# Patient Record
Sex: Female | Born: 2000 | Race: White | Hispanic: No | Marital: Single | Smoking: Current every day smoker
Health system: Southern US, Community
[De-identification: ages and names within clinical notes are randomized; demographics above are authoritative.]

## PROBLEM LIST (undated history)

## (undated) DIAGNOSIS — H7290 Unspecified perforation of tympanic membrane, unspecified ear: Secondary | ICD-10-CM

## (undated) DIAGNOSIS — F909 Attention-deficit hyperactivity disorder, unspecified type: Secondary | ICD-10-CM

## (undated) HISTORY — PX: TYMPANOSTOMY TUBE PLACEMENT: SHX32

## (undated) HISTORY — DX: Attention-deficit hyperactivity disorder, unspecified type: F90.9

## (undated) HISTORY — DX: Unspecified perforation of tympanic membrane, unspecified ear: H72.90

---
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
## 2019-01-15 ENCOUNTER — Encounter: Payer: Self-pay | Admitting: Pediatrics

## 2019-01-15 ENCOUNTER — Ambulatory Visit (INDEPENDENT_AMBULATORY_CARE_PROVIDER_SITE_OTHER): Payer: Medicaid Other | Admitting: Pediatrics

## 2019-01-15 ENCOUNTER — Other Ambulatory Visit: Payer: Self-pay

## 2019-01-15 VITALS — BP 112/76 | Ht 64.0 in | Wt 123.2 lb

## 2019-01-15 DIAGNOSIS — M542 Cervicalgia: Secondary | ICD-10-CM

## 2019-01-15 DIAGNOSIS — Z79899 Other long term (current) drug therapy: Secondary | ICD-10-CM

## 2019-01-15 MED ORDER — AMPHETAMINE-DEXTROAMPHET ER 30 MG PO CP24: 60.0000 mg | ORAL_CAPSULE | Freq: Every day | ORAL | 0 refills | Status: DC

## 2019-01-15 NOTE — Progress Notes (Signed)
Subjective:     [name] [name] is a 18 y.o. female who presents for evaluation of neck pain and medication management for ADHD. Event that precipitated these symptoms: twisting injury while dancing at home. Onset of symptoms was 3 days ago, and have been gradually improving since that time. Current symptoms are stiffness with FROM. Patient denies numbness, parasthesias, tingling in extremities. Patient has had no prior neck problems. Previous treatments: none.  The following portions of the patient's history were reviewed and updated as appropriate: allergies, current medications, past family history, past medical history, past social history, past surgical history and problem list.  Review of Systems Pertinent items are noted in HPI.    Objective:    BP 112/76   Ht 5\' 4"  (1.626 m)   Wt 123 lb 4 oz (55.9 kg)   BMI 21.16 kg/m  General:   alert, cooperative, appears stated age and no distress  External Deformity:  absent  ROM Cervical Spine:  normal range of motion  Midline Tenderness:  absent midline  Paraspinous tenderness:  absent midline  UE Neurologic Exam:  unremarkable    Assessment:    Neck pain due to muscle soresness   Medication management   Plan:    Discussed appropriate exercises. Discussed appropriate use of ice and heat. OTC analgesics as needed.   ADHD meds refilled after normal weight and Blood pressure. Doing well on present dose. See again in 3 months

## 2019-02-09 ENCOUNTER — Ambulatory Visit (INDEPENDENT_AMBULATORY_CARE_PROVIDER_SITE_OTHER): Payer: Medicaid Other | Admitting: Pediatrics

## 2019-02-09 ENCOUNTER — Encounter: Payer: Self-pay | Admitting: Pediatrics

## 2019-02-09 DIAGNOSIS — Z113 Encounter for screening for infections with a predominantly sexual mode of transmission: Secondary | ICD-10-CM

## 2019-02-09 DIAGNOSIS — R112 Nausea with vomiting, unspecified: Secondary | ICD-10-CM

## 2019-02-09 DIAGNOSIS — N921 Excessive and frequent menstruation with irregular cycle: Secondary | ICD-10-CM | POA: Diagnosis not present

## 2019-02-09 DIAGNOSIS — N946 Dysmenorrhea, unspecified: Secondary | ICD-10-CM

## 2019-02-09 DIAGNOSIS — Z720 Tobacco use: Secondary | ICD-10-CM

## 2019-02-09 MED ORDER — NORETHINDRONE 0.35 MG PO TABS: 1.0000 | ORAL_TABLET | Freq: Every day | ORAL | 11 refills | Status: AC

## 2019-02-09 NOTE — Progress Notes (Signed)
THIS RECORD MAY CONTAIN CONFIDENTIAL INFORMATION THAT SHOULD NOT BE RELEASED WITHOUT REVIEW OF THE SERVICE PROVIDER.  Virtual Visit via Video Note  I connected with [name] [name]   on 02/09/19 at 11:00 AM EST by a video enabled telemedicine application and verified that I am speaking with the correct person using two identifiers.    This patient visit was completed through the use of an audio/video or telephone encounter in the setting of the State of Emergency due to the COVID-19 Pandemic.  I discussed that the purpose of this telehealth visit is to provide medical care while limiting exposure to the novel coronavirus.       I discussed the limitations of evaluation and management by telemedicine and the availability of in person appointments.    The patient expressed understanding and agreed to proceed.   The patient was physically located at home in [place] or a state in which I am permitted to provide care. The patient and/or parent/guardian understood that s/he may incur co-pays and cost sharing, and agreed to the telemedicine visit. The visit was reasonable and appropriate under the circumstances given the patient's presentation at the time.   The patient and/or parent/guardian has been advised of the potential risks and limitations of this mode of treatment (including, but not limited to, the absence of in-person examination) and has agreed to be treated using telemedicine. The patient's/patient's family's questions regarding telemedicine have been answered.    As this visit was completed in an ambulatory virtual setting, the patient and/or parent/guardian has also been advised to contact their provider's office for worsening conditions, and seek emergency medical treatment and/or call 911 if the patient deems either necessary.   Adolescent Medicine Consultation Initial Visit [name]  is a 19 y.o. female referred by Leveda Anna, NP here today for evaluation of  dysmenorrhea.       History was provided by the patient.  Team Care Documentation:  Team care documentation used during this visit? yes Team care members present and location: Yes  Chief complaint: Dysmenorrhea  HPI:   19 year old female with a history of ADHD presenting for evaluation of dysmenorrhea.  Had Menarche in 9th grade, when first started, no cramping just period. About a year later, cramping began. Now to the point that she has trouble moving, extreme nausea that often results in vomiting. Feels she has the flu when she is on her period. Uses pads/tampons, 7-10 day cycle; first few days are very heavy - has to use 4 super tampons per day. No significant family hx of dysmenorrhea   Cramps usually start one week before until half way through her period, then three or four days after, she gets nausea. The middle part of her period is mainly pain free.  She's tried warmth and ibuprofen with minimal benefit. Her dose of ibuprofen 400 mg is taken throughout her cycle, helps little.  Tylenol 325 mg actually helps more than ibuprofen.   Sexually active with female partner only, condoms used consistently. No concerns for STI symptoms. She and BF are 7 hours away from each other, but also planning to move in together in a few months.  No PMH of migraines, mom passed away d/t vasculitis; GF on blood thinner but unsure why. No PMH of frequent nose bleeds.  Previsit planning completed:  yes  PCP Confirmed?  yes  My Chart Activated?   no    LMP: finished 2 days ago; prior to that cycle was  beginning of December  Longest cycle about 6-7 weeks in between; shortest cycle: 2 weeks   Review of Systems  Constitutional: Negative for chills, fever and malaise/fatigue.  HENT: Negative for sore throat.   Eyes: Negative for blurred vision.  Respiratory: Negative for cough, shortness of breath and wheezing.   Cardiovascular: Negative for chest pain.  Gastrointestinal: Positive for abdominal  pain, nausea (with cycle ) and vomiting.  Skin: Negative for rash.  Neurological: Negative for headaches.  Endo/Heme/Allergies: Does not bruise/bleed easily.    No Known Allergies Outpatient Medications Prior to Visit  Medication Sig Dispense Refill  . acetaminophen (TYLENOL) 325 MG tablet Take 2 tablets (650 mg total) by mouth every 4 (four) hours as needed for mild pain. 40 tablet 1  . amphetamine-dextroamphetamine (ADDERALL XR) 30 MG 24 hr capsule Take 2 capsules (60 mg total) by mouth daily. 62 capsule 0  . [START ON 02/15/2019] amphetamine-dextroamphetamine (ADDERALL XR) 30 MG 24 hr capsule Take 2 capsules (60 mg total) by mouth daily. 62 capsule 0  . [START ON 03/18/2019] amphetamine-dextroamphetamine (ADDERALL XR) 30 MG 24 hr capsule Take 2 capsules (60 mg total) by mouth daily. 62 capsule 0   No facility-administered medications prior to visit.     Patient Active Problem List   Diagnosis Date Noted  . Dysuria 12/09/2017  . Pneumothorax 08/27/2017  . Immunization due 03/27/2017  . Failed vision screen 03/10/2017  . Left knee injury, initial encounter 12/05/2016  . Otitis, externa, infective, bilateral 08/14/2016  . Acute otitis media in pediatric patient, bilateral 08/01/2016  . Medication management 03/07/2016  . Well adolescent visit 09/21/2015  . Attention deficit hyperactivity disorder (ADHD), combined type 10/12/2014  . Plantar wart of left foot 07/14/2014  . BMI (body mass index), pediatric, 5% to less than 85% for age 56/29/2015  . Attention deficit disorder (ADD) 12/31/2010  . 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  yes Past Medical History:  Diagnosis Date  . ADHD (attention deficit hyperactivity disorder) 12/31/2010  . Hearing loss   . 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 yes Family  History  Problem Relation Age of Onset  . ADD / ADHD Brother   . ADD / ADHD Brother   . Diabetes Brother   . ADD / ADHD Brother   . Allergies Brother   . Diabetes Maternal Grandmother   . Hypertension Maternal Grandmother   . Cancer Maternal Grandfather   . Alcohol abuse Neg Hx   . Arthritis Neg Hx   . Asthma Neg Hx   . Birth defects Neg Hx   . COPD Neg Hx   . Depression Neg Hx   . Drug abuse Neg Hx   . Early death Neg Hx   . Hearing loss Neg Hx   . Heart disease Neg Hx   . Hyperlipidemia Neg Hx   . Kidney disease Neg Hx   . Learning disabilities Neg Hx   . Mental illness Neg Hx   . Mental retardation Neg Hx   . Miscarriages / Stillbirths Neg Hx   . Stroke Neg Hx   . Vision loss Neg Hx   . Varicose Veins Neg Hx   Brother (age in 37s)  is insulin-dependent x 2 years   Confidentiality was discussed with the patient and if applicable, with caregiver as well.  Gender  identity: female Sex assigned at birth: female Pronouns: she Tobacco?  Yes, vaping nicotine, store bought vape only; daily vaping takes about one month to go through a 1500/vapes; has been smoking since 10th grade; knows that she will reach out for help if she cannot quit on her own.  -by the end of month has goal to be not smoking!   Drugs/ETOH?  Once ETOH nothing else ever  Partner preference?  female  Sexually Active?  yes  Pregnancy Prevention:  condoms Reviewed condoms:  yes Reviewed EC:  yes   Trusted adult at home/school:  Yes, grandmother  Visual Observations/Objective:   General Appearance: Well nourished well developed, in no apparent distress.  Eyes: conjunctiva no swelling or erythema ENT/Mouth: No hoarseness, No cough for duration of visit.  Neck: Supple  Respiratory: Respiratory effort normal, normal rate, no retractions or distress.   Cardio: Appears well-perfused, noncyanotic Musculoskeletal: no obvious deformity Skin: visible skin without rashes, ecchymosis, erythema Neuro: Awake and  oriented X 3,  Psych:  normal affect, Insight and Judgment appropriate.    Assessment/Plan: 1. Dysmenorrhea. Reviewed options of COC versus progesterone-only options as she is still vaping some (~1 cannister/month). Discussed that would be safe for estrogen use as a risk of clot with pregnancy would be higher if to become pregnant while smoking. She noted that she would be ok with pregnancy, but wanted her symptoms to improved and preferred a progesterone only option due to a friend having lots of blood clots on estrogen. She does not want a procedure at this time and prefers to take a pill. Sister had bad experience with Nexplanon.  - Start minipill, counseled on importance of taking same time everyday and back up contraception  - discussed if stopped vaping, may consider alternative method - Continue ibuprofen PRN - POC Urine Pregnancy (dx code Z32.02); Future  2. Menorrhagia with irregular cycle. Suspect anovulatory bleeding as ROS rather unremarkable for thyroid disease, blood dyscrasia, prolactinoma. Of note, mother does have history of vasculitis. Due to unclear etiology and does seem to have some longer cycles (~10 days) and appears to have had microcytic anemia in 2019 (unsure if addressed) will complete broad laboratory evaluation.  - CBC; Future - Ferritin; Future - Comprehensive metabolic panel; Future - DHEA-sulfate; Future - Follicle stimulating hormone; Future - Luteinizing hormone; Future - Prolactin; Future - Testos,Total,Free and SHBG (Female); Future - T4, free; Future - TSH; Future - POC Urine Pregnancy (dx code Z32.02); Future  3. Current every day nicotine vaping - discussed cessation and her goal for quitting by end of month  - will let us know if efforts are not going well and would like assistance  I discussed the assessment and treatment plan with the patient and/or parent/guardian.  They were provided an opportunity to ask questions and all were answered.  They  agreed with the plan and demonstrated an understanding of the instructions. They were advised to call back or seek an in-person evaluation in the emergency room if the symptoms worsen or if the condition fails to improve as anticipated.   Follow-up:   For labs, in 2-3 months  Medical decision-making:   I spent 60 minutes on this telehealth visit inclusive of face-to-face video and care coordination time I was located in [place] during this encounter.   Fontaine No, MD    CC: Janene Harvey, Pascal Lux, NP, Janene Harvey, Pascal Lux, NP

## 2019-02-18 ENCOUNTER — Other Ambulatory Visit: Payer: Self-pay

## 2019-02-18 ENCOUNTER — Other Ambulatory Visit (HOSPITAL_COMMUNITY)
Admission: RE | Admit: 2019-02-18 | Discharge: 2019-02-18 | Disposition: A | Discharge status: Home / Self Care | Payer: Medicaid Other | Source: Ambulatory Visit | Attending: Pediatrics | Admitting: Pediatrics

## 2019-02-18 ENCOUNTER — Ambulatory Visit (INDEPENDENT_AMBULATORY_CARE_PROVIDER_SITE_OTHER): Payer: Medicaid Other

## 2019-02-18 DIAGNOSIS — N946 Dysmenorrhea, unspecified: Secondary | ICD-10-CM | POA: Diagnosis not present

## 2019-02-18 DIAGNOSIS — N921 Excessive and frequent menstruation with irregular cycle: Secondary | ICD-10-CM

## 2019-02-18 DIAGNOSIS — Z113 Encounter for screening for infections with a predominantly sexual mode of transmission: Secondary | ICD-10-CM

## 2019-02-18 LAB — POCT URINE PREGNANCY: Preg Test, Ur: NEGATIVE

## 2019-02-18 NOTE — Progress Notes (Signed)
Pt here today for labs. Labs collected and sent per protocol.

## 2019-02-19 LAB — URINE CYTOLOGY ANCILLARY ONLY
Chlamydia: NEGATIVE
Comment: NEGATIVE
Comment: NORMAL
Neisseria Gonorrhea: NEGATIVE

## 2019-02-22 ENCOUNTER — Other Ambulatory Visit: Payer: Self-pay | Admitting: Family

## 2019-02-22 ENCOUNTER — Other Ambulatory Visit: Payer: Self-pay | Admitting: Pediatrics

## 2019-02-22 ENCOUNTER — Telehealth: Payer: Self-pay

## 2019-02-22 DIAGNOSIS — N921 Excessive and frequent menstruation with irregular cycle: Secondary | ICD-10-CM

## 2019-02-22 DIAGNOSIS — E0590 Thyrotoxicosis, unspecified without thyrotoxic crisis or storm: Secondary | ICD-10-CM

## 2019-02-22 DIAGNOSIS — R7989 Other specified abnormal findings of blood chemistry: Secondary | ICD-10-CM

## 2019-02-22 LAB — COMPREHENSIVE METABOLIC PANEL
AG Ratio: 1.8 (calc) (ref 1.0–2.5)
ALT: 24 U/L (ref 5–32)
AST: 23 U/L (ref 12–32)
Albumin: 4.2 g/dL (ref 3.6–5.1)
Alkaline phosphatase (APISO): 95 U/L (ref 36–128)
BUN: 13 mg/dL (ref 7–20)
CO2: 23 mmol/L (ref 20–32)
Calcium: 9.6 mg/dL (ref 8.9–10.4)
Chloride: 106 mmol/L (ref 98–110)
Creat: 0.54 mg/dL (ref 0.50–1.00)
Globulin: 2.4 g/dL (calc) (ref 2.0–3.8)
Glucose, Bld: 128 mg/dL — ABNORMAL HIGH (ref 65–99)
Potassium: 3.8 mmol/L (ref 3.8–5.1)
Sodium: 138 mmol/L (ref 135–146)
Total Bilirubin: 0.2 mg/dL (ref 0.2–1.1)
Total Protein: 6.6 g/dL (ref 6.3–8.2)

## 2019-02-22 LAB — CBC
HCT: 34.6 % — ABNORMAL LOW (ref 35.0–45.0)
Hemoglobin: 10.8 g/dL — ABNORMAL LOW (ref 11.7–15.5)
MCH: 21.5 pg — ABNORMAL LOW (ref 27.0–33.0)
MCHC: 31.2 g/dL — ABNORMAL LOW (ref 32.0–36.0)
MCV: 68.8 fL — ABNORMAL LOW (ref 80.0–100.0)
MPV: 10.9 fL (ref 7.5–12.5)
Platelets: 372 10*3/uL (ref 140–400)
RBC: 5.03 10*6/uL (ref 3.80–5.10)
RDW: 16 % — ABNORMAL HIGH (ref 11.0–15.0)
WBC: 6.3 10*3/uL (ref 3.8–10.8)

## 2019-02-22 LAB — FERRITIN: Ferritin: 4 ng/mL — ABNORMAL LOW (ref 16–154)

## 2019-02-22 LAB — TESTOS,TOTAL,FREE AND SHBG (FEMALE)
Free Testosterone: 2 pg/mL (ref 0.1–6.4)
Sex Hormone Binding: 128 nmol/L — ABNORMAL HIGH (ref 17–124)
Testosterone, Total, LC-MS-MS: 31 ng/dL (ref 2–45)

## 2019-02-22 LAB — TSH: TSH: 0.01 mIU/L — ABNORMAL LOW

## 2019-02-22 LAB — PROLACTIN: Prolactin: 10.5 ng/mL

## 2019-02-22 LAB — FOLLICLE STIMULATING HORMONE: FSH: 5.2 m[IU]/mL

## 2019-02-22 LAB — DHEA-SULFATE: DHEA-SO4: 221 ug/dL (ref 51–321)

## 2019-02-22 LAB — T4, FREE: Free T4: 1.9 ng/dL — ABNORMAL HIGH (ref 0.8–1.4)

## 2019-02-22 LAB — LUTEINIZING HORMONE: LH: 6.3 m[IU]/mL

## 2019-02-22 NOTE — Telephone Encounter (Signed)
Pt is very confused and would like a call back.

## 2019-02-23 ENCOUNTER — Other Ambulatory Visit: Payer: Self-pay | Admitting: Pediatrics

## 2019-02-23 ENCOUNTER — Ambulatory Visit: Payer: Medicaid Other

## 2019-02-23 DIAGNOSIS — R7989 Other specified abnormal findings of blood chemistry: Secondary | ICD-10-CM

## 2019-02-23 NOTE — Telephone Encounter (Signed)
Called number on file, no answer, no VM option avail. 

## 2019-02-23 NOTE — Telephone Encounter (Signed)
Left VM on home line as well to call office to reschedule nurse visit for vitals and labs- also to answer Zriyah's questions regarding abnormal labs.

## 2019-02-24 ENCOUNTER — Telehealth: Payer: Self-pay | Admitting: Pediatrics

## 2019-02-24 NOTE — Telephone Encounter (Signed)

## 2019-02-25 ENCOUNTER — Other Ambulatory Visit: Payer: Self-pay

## 2019-02-25 ENCOUNTER — Encounter: Payer: Self-pay | Admitting: Pediatrics

## 2019-02-25 ENCOUNTER — Ambulatory Visit (INDEPENDENT_AMBULATORY_CARE_PROVIDER_SITE_OTHER): Payer: Medicaid Other | Admitting: Pediatrics

## 2019-02-25 ENCOUNTER — Ambulatory Visit (INDEPENDENT_AMBULATORY_CARE_PROVIDER_SITE_OTHER): Payer: Medicaid Other | Admitting: Family

## 2019-02-25 VITALS — BP 140/78 | HR 154 | Ht 64.0 in | Wt 120.8 lb

## 2019-02-25 DIAGNOSIS — E0590 Thyrotoxicosis, unspecified without thyrotoxic crisis or storm: Secondary | ICD-10-CM | POA: Insufficient documentation

## 2019-02-25 DIAGNOSIS — R7989 Other specified abnormal findings of blood chemistry: Secondary | ICD-10-CM

## 2019-02-25 DIAGNOSIS — R030 Elevated blood-pressure reading, without diagnosis of hypertension: Secondary | ICD-10-CM

## 2019-02-25 DIAGNOSIS — Z1389 Encounter for screening for other disorder: Secondary | ICD-10-CM

## 2019-02-25 DIAGNOSIS — Z79899 Other long term (current) drug therapy: Secondary | ICD-10-CM | POA: Diagnosis not present

## 2019-02-25 DIAGNOSIS — R251 Tremor, unspecified: Secondary | ICD-10-CM | POA: Diagnosis not present

## 2019-02-25 DIAGNOSIS — R000 Tachycardia, unspecified: Secondary | ICD-10-CM | POA: Diagnosis not present

## 2019-02-25 MED ORDER — ATENOLOL 25 MG PO TABS: 25.0000 mg | ORAL_TABLET | Freq: Every day | ORAL | 2 refills | Status: DC

## 2019-02-25 MED ORDER — METHIMAZOLE 5 MG PO TABS: 5.0000 mg | ORAL_TABLET | Freq: Two times a day (BID) | ORAL | 1 refills | Status: DC

## 2019-02-25 NOTE — Patient Instructions (Signed)
You have hyperthyroidism.  We are going to start treatment with two medications.  Methimazole:  Take this medication in the morning and afternoon.  Atenolol: Take this medication in the morning if your heart rate is greater than 100.  Follow up with Endocrinology in 2 weeks to ensure that you levels are returning to normal.

## 2019-02-25 NOTE — Progress Notes (Signed)
History was provided by the patient and sister.  [name] [name] is a 19 y.o. female who is here for vital check.     HPI:    Hyperthyroidism [name] [name] presented to clinic today for a vital check after routine labs demonstrated hyperthyroidism.  She was found to have a heart rate of 177 and a blood pressure of 145/79.  At that time, the CMA informed us that her vitals were elevated and she was assessed by a physician.  She reports not recent changes or concerning symptoms at this time.  She reports that she has been sleeping well as much as 7 hours night appetite remains decreased (at her baseline).  She has not noticed fever, increased bowel movements, hair thinning.  She reports that she did wake up this morning and feel her heart was racing and more anxious than usual.  She is not sure why this might be.  She reports that she did take her Adderall shortly before coming to clinic today without taking breakfast.  Physical Exam:  BP (!) 145/79   Pulse (!) 177   Ht 5\' 4"  (1.626 m)   Wt 54.8 kg   BMI 20.74 kg/m   Blood pressure percentiles are not available for patients who are 18 years or older. No LMP recorded.    General: Anxious appearing 19 year old girl.  Sitting comfortably on the exam table.  Mild fidgeting/tremulousness during our interview. HEENT: Moist mucous membranes.  Normal oropharynx.  Small goiter notable on exam.  No evident cervical lymphadenopathy. Respiratory: Breathing comfortably on room air.  Normal respiratory effort.  Lungs are clear to auscultation bilaterally. Cardiac: Tachycardic on exam.  Regular rhythm.  No murmurs. Abdomen: No hepatosplenomegaly, soft, nontender to palpation.  Bowel sounds normal. Skin: Moist, warm. Lower extremities: No lower extremity swelling.  Assessment/Plan:  Symptomatic hyperthyroidism Tachycardia, elevated blood pressure, tremulous.  Afebrile, no nausea/vomiting, no evidence of confusion.  No concern for thyroid storm at this  time.  Vitals likely influenced by her morning Adderall dose.   12, NP with endocrinology was contacted while [name] [name] remained in the clinic.  He recommended starting methimazole 5 mg twice daily and atenolol 25 mg in the morning for heart rate greater than 100.  Recommend follow-up labs in 2 weeks at which time she could be seen in the endocrine clinic.  [name] [name] was reassured that her present symptoms of hyperthyroidism could be managed in the outpatient setting.  She was prescribed the above-noted methimazole and atenolol and encouraged to follow-up in the endocrine clinic in 2 weeks.  All questions were answered at that time.  [name] Basta, MD  02/25/19

## 2019-02-25 NOTE — Progress Notes (Signed)
Supervising Provider Co-Signature  I reviewed with the resident the medical history and the resident's findings on physical examination.  I discussed with the resident the patient's diagnosis and concur with the treatment plan as documented in the resident's note.  Trayquan Kolakowski M Navil Kole, NP 

## 2019-02-25 NOTE — Progress Notes (Signed)
Virtual Visit via Telephone Note  I connected with [name] [name] 's patient  on 02/25/19 at 12:00 PM EST by telephone and verified that I am speaking with the correct person using two identifiers. Location of patient/parent: personal vehicle   I discussed the limitations, risks, security and privacy concerns of performing an evaluation and management service by telephone and the availability of in person appointments. I discussed that the purpose of this phone visit is to provide medical care while limiting exposure to the novel coronavirus.  I also discussed with the patient that there may be a patient responsible charge related to this service. The patient expressed understanding and agreed to proceed.  Reason for visit: questions  History of Present Illness:  [name] [name] is a 19 year old young woman seen today for vital sign check at [facility] after routine labs showed hyperthyroidism. During the visit, her HR was elevated at 154 and her BP was 140/78. The provider did a consult with endocrinology while [name] was in the office as elevated HR, elevated BP. It was recommended that she start atenolol PRN for HR>100 and methimazole BID. She was instructed to have follow up with endocrinology in 2 weeks with a recheck of labs. [name] states that sometimes she wakes up hyper and today was one of those days. She and her grandmother feel that she doesn't need to be on the atenolol and question that if she doesn't need the heart medicine then she doesn't need the thyroid medicine. [name] would like a referral to endocrinology for a second opinion.    Assessment and Plan:  Discussed with Shakeela her HR and BP, both of which were elevated and most likely not caused by ADHD medication since she's been on the same dose of the same medication for a while. Explained why the atenolol was prescribed and reviewed when it was to be taken. Explained the elevated TSH/Free T4 and medication prescribed  [name] was still  resistant to taking either medication. Recommended Dartha call the prescribing office back and ask for clarification Reminded her that she has a follow up appointment in 2 weeks and that she hasn't been seen directly by endocrinology yet.   Will refer to endocrinology for second opinion though recommend being seen by endocrinology at [facility] first  Follow Up Instructions:  Follow up with prescribing provider with questions on atenolol and methimazole    I discussed the assessment and treatment plan with the patient and/or parent/guardian. They were provided an opportunity to ask questions and all were answered. They agreed with the plan and demonstrated an understanding of the instructions.   They were advised to call back or seek an in-person evaluation in the emergency room if the symptoms worsen or if the condition fails to improve as anticipated.  I spent 15 minutes of non-face-to-face time on this telephone visit.    I was located at [facility] during this encounter.  Calla Kicks, NP

## 2019-02-25 NOTE — Patient Instructions (Signed)
Follow up with adolescent medicine office regarding questions for medications prescribed today Keep endocrinology appointment scheduled in 2 weeks

## 2019-02-25 NOTE — Progress Notes (Addendum)
Pt's [name] [name] called the front office to inform the clinic that she did not think that [name] [name] needed the medication she was prescribed today and would be seeking a second opinion.  She will not be giving the prescribed medication.  Multiple attempts to call [name] [name] and [name] [name] were unsuccessful.  [name] [name] has a Geophysicist/field seismologist that is full.  [name] [name]'s phone number would not permit a message to be left.  Will continue to attempt calls as [name] [name] has a condition that can become life threatening if left untreated.  She is currently stable (at last assessment in clinic) but neglect of her condition will be perilous.   [name] Mo, MD

## 2019-03-01 ENCOUNTER — Telehealth: Payer: Self-pay | Admitting: Pediatrics

## 2019-03-01 NOTE — Telephone Encounter (Signed)
[name] [name] was diagnosed with hyperthyroidism and started on methimazole 4 days ago while seeing adolescent medicine specialists. At that time, [name] [name] and her grandmother felt that it was a misdiagnosis and didn't feel that [name] [name] needed to start the methimazole. I spoke with her at that time, instructing her to start the medication. Her labs drawn on 02/18/2019 resulted withTSH was 0.01 well below normal range and her Free T4 was 1.9. She was very emotional, very adamant that she needed a second opinion. I encouraged her to follow up with the prescribing provider for further clarification. Labs drawn on 02/25/2019 showed an increase in Free T4 to 2.5, a 0.6 increase over a 7 day time period. [name] [name] was able to see the lab results and realized that with an increase over a short period of time, she should take the medication. [name] [name] reports that she has since started taking the methimazole and will keep her appointment on 03/10/2019 with endocrinology.

## 2019-03-03 LAB — T3: T3, Total: 395 ng/dL — ABNORMAL HIGH (ref 86–192)

## 2019-03-03 LAB — THYROGLOBULIN ANTIBODY: Thyroglobulin Ab: 1 IU/mL (ref <=1)

## 2019-03-03 LAB — THYROID STIMULATING IMMUNOGLOBULIN: TSI: 342 % baseline — ABNORMAL HIGH (ref <140)

## 2019-03-03 LAB — THYROID PEROXIDASE ANTIBODIES (TPO) (REFL): Thyroperoxidase Ab SerPl-aCnc: 1 IU/mL (ref <9)

## 2019-03-03 LAB — T4, FREE: Free T4: 2.5 ng/dL — ABNORMAL HIGH (ref 0.8–1.4)

## 2019-03-03 LAB — TSH: TSH: 0.01 mIU/L — ABNORMAL LOW

## 2019-03-10 ENCOUNTER — Encounter (INDEPENDENT_AMBULATORY_CARE_PROVIDER_SITE_OTHER): Payer: Self-pay | Admitting: Family

## 2019-03-10 ENCOUNTER — Other Ambulatory Visit: Payer: Self-pay

## 2019-03-10 ENCOUNTER — Ambulatory Visit (INDEPENDENT_AMBULATORY_CARE_PROVIDER_SITE_OTHER): Payer: Medicaid Other | Admitting: Family

## 2019-03-10 VITALS — BP 132/74 | HR 122 | Ht 64.13 in | Wt 125.8 lb

## 2019-03-10 DIAGNOSIS — R251 Tremor, unspecified: Secondary | ICD-10-CM

## 2019-03-10 DIAGNOSIS — R253 Fasciculation: Secondary | ICD-10-CM

## 2019-03-10 DIAGNOSIS — R000 Tachycardia, unspecified: Secondary | ICD-10-CM | POA: Diagnosis not present

## 2019-03-10 DIAGNOSIS — E0590 Thyrotoxicosis, unspecified without thyrotoxic crisis or storm: Secondary | ICD-10-CM | POA: Diagnosis not present

## 2019-03-10 DIAGNOSIS — E0500 Thyrotoxicosis with diffuse goiter without thyrotoxic crisis or storm: Secondary | ICD-10-CM | POA: Diagnosis not present

## 2019-03-10 NOTE — Progress Notes (Signed)
Pediatric Endocrinology Consultation Initial Visit  Hallmark, Bannie [birth year]  Estelle June, NP  Chief Complaint: Hyperthyroid/Graves  History obtained from: [place], and review of records from PCP  HPI: Dayra  is a 19 y.o. female being seen in consultation at the request of  [name], Pascal Lux, NP for evaluation of the above concerns.   1.  Madalin was seen by her adolescent medicine on 01/2018 where she was noted to have lmenorrhagia so labs were ordered. Her TSH resulted at 0.01 with a FT4 of 1.9. [name], NP contacted me at that time and I advised to repeat labs with thyroid antibodies. Her second set of labs showed elevated TSI that is positive for Graves disease, her TSH had also remained at 0.01 with a higher FT4 of 2.5 and elevated T3 of 395. She was started on 5 mg of Methimazole BID and 25 mg of Atenolol daily for symptom control.  she is referred to [facility] ([facility]) for further evaluation.    2. She reports that she was initially skeptical and in shock about her diagnosis. She did not start taking the Methimazole that was prescribed until about a week ago. She estimates she misses about 1/3 of the doses because she forgets. She has Atenolol for symptom control but has only been taking it about 50% of the time. She is now motivated to start taking both consistently   She has multiple family member with hyperthyroidism including her mother, grandmother and great grandmother.   Thyroid symptoms: Heat or cold intolerance: + hot  Weight changes: denies Energy level: + poor energy at times, to much at other times Sleep: + poor sleep Skin changes: denies Constipation/Diarrhea: denies Difficulty swallowing: denies Neck swelling: Denies Periods regular: + menorrhagia  Tremor: + tremors  Palpitations: + tachycardia.    ROS: All systems reviewed with pertinent positives listed below; otherwise negative. Constitutional: Weight as above.  Poor sleep   HEENT: No vision changes. No neck pain. No difficulty swallowing.  Respiratory: No increased work of breathing currently Cardiac: + tachycardia. No palpitations.  GI: No constipation or diarrhea, + "some stomach aches"  GU: No nocturia. No polyuria.  Musculoskeletal: No joint deformity Neuro: Normal affect. + tremors and anxiety.  Endocrine: As above   Past Medical History:  Past Medical History:  Diagnosis Date  . ADHD (attention deficit hyperactivity disorder) 12/31/2010  . Hearing loss   . 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 Delivered at term Discharged home with mom  Meds: Outpatient Encounter Medications as of 03/10/2019  Medication Sig  . acetaminophen (TYLENOL) 325 MG tablet Take 2 tablets (650 mg total) by mouth every 4 (four) hours as needed for mild pain.  Marland Kitchen amphetamine-dextroamphetamine (ADDERALL XR) 30 MG 24 hr capsule Take 2 capsules (60 mg total) by mouth daily.  Marland Kitchen atenolol (TENORMIN) 25 MG tablet Take 1 tablet (25 mg total) by mouth daily. Take in the morning if your heart rate is greater than 100.  . methimazole (TAPAZOLE) 5 MG tablet Take 1 tablet (5 mg total) by mouth 2 (two) times daily.  Marland Kitchen amphetamine-dextroamphetamine (ADDERALL XR) 30 MG 24 hr capsule Take 2 capsules (60 mg total) by mouth daily.  Melene Muller ON 03/18/2019] amphetamine-dextroamphetamine (ADDERALL XR) 30 MG 24 hr capsule Take 2 capsules (60 mg total) by mouth daily. (Patient not taking: Reported on 03/10/2019)  . norethindrone (MICRONOR) 0.35 MG tablet Take 1 tablet (0.35 mg  total) by mouth daily. (Patient not taking: Reported on 03/10/2019)   No facility-administered encounter medications on file as of 03/10/2019.    Allergies: No Known Allergies  Surgical History: Past Surgical History:  Procedure Laterality Date  . TYMPANOSTOMY TUBE PLACEMENT     twice    Family History:  Family History  Problem Relation Age  of Onset  . ADD / ADHD Brother   . ADD / ADHD Brother   . Diabetes Brother   . ADD / ADHD Brother   . Allergies Brother   . Diabetes Maternal Grandmother   . Hypertension Maternal Grandmother   . Cancer Maternal Grandfather   . Alcohol abuse Neg Hx   . Arthritis Neg Hx   . Asthma Neg Hx   . Birth defects Neg Hx   . COPD Neg Hx   . Depression Neg Hx   . Drug abuse Neg Hx   . Early death Neg Hx   . Hearing loss Neg Hx   . Heart disease Neg Hx   . Hyperlipidemia Neg Hx   . Kidney disease Neg Hx   . Learning disabilities Neg Hx   . Mental illness Neg Hx   . Mental retardation Neg Hx   . Miscarriages / Stillbirths Neg Hx   . Stroke Neg Hx   . Vision loss Neg Hx   . Varicose Veins Neg Hx      Social History: Lives with: Grandmother, 3 brother and 2 nieces.    Physical Exam:  Vitals:   03/10/19 0900  BP: 132/74  Pulse: (!) 104  Weight: 125 lb 12.8 oz (57.1 kg)  Height: 5' 4.13" (1.629 m)    Body mass index: body mass index is 21.5 kg/m. Blood pressure percentiles are not available for patients who are 18 years or older.  Wt Readings from Last 3 Encounters:  03/10/19 125 lb 12.8 oz (57.1 kg) (48 %, Z= -0.06)*  02/25/19 120 lb 12.8 oz (54.8 kg) (38 %, Z= -0.32)*  01/15/19 123 lb 4 oz (55.9 kg) (43 %, Z= -0.17)*   * Growth percentiles are based on CDC (Girls, 2-20 Years) data.   Ht Readings from Last 3 Encounters:  03/10/19 5' 4.13" (1.629 m) (48 %, Z= -0.06)*  02/25/19 5\' 4"  (1.626 m) (46 %, Z= -0.11)*  01/15/19 5\' 4"  (1.626 m) (46 %, Z= -0.11)*   * Growth percentiles are based on CDC (Girls, 2-20 Years) data.     48 %ile (Z= -0.06) based on CDC (Girls, 2-20 Years) weight-for-age data using vitals from 03/10/2019. 48 %ile (Z= -0.06) based on CDC (Girls, 2-20 Years) Stature-for-age data based on Stature recorded on 03/10/2019. 49 %ile (Z= -0.03) based on CDC (Girls, 2-20 Years) BMI-for-age based on BMI available as of 03/10/2019.  General: Well developed, well  nourished female in no acute distress.  Alert and oriented.  Head: Normocephalic, atraumatic.   Eyes:  Pupils equal and round. EOMI.   Sclera white.  No eye drainage.   Ears/Nose/Mouth/Throat: Nares patent, no nasal drainage.  Normal dentition, mucous membranes moist.  + tongue fasciculations  Neck: supple, no cervical lymphadenopathy, + thyromegaly. No nodules, no tenderness.  Cardiovascular: + tachycardia (122 bpm), normal S1/S2, no murmurs Respiratory: No increased work of breathing.  Lungs clear to auscultation bilaterally.  No wheezes. Abdomen: soft, nontender, nondistended. Normal bowel sounds.  No appreciable masses  Extremities: warm, well perfused, cap refill < 2 sec.   Musculoskeletal: Normal muscle mass.  Normal strength Skin: warm, dry.  No rash or lesions.  Neurologic: alert and oriented, normal speech, + tremor    Laboratory Evaluation: Results for orders placed or performed in visit on 02/25/19  Thyroid Peroxidase Antibodies (TPO) (REFL)  Result Value Ref Range   Thyroperoxidase Ab SerPl-aCnc 1 <9 IU/mL  Thyroid stimulating immunoglobulin  Result Value Ref Range   TSI 342 (H) <140 % baseline  Thyroglobulin antibody  Result Value Ref Range   Thyroglobulin Ab 1 < or = 1 IU/mL  T3  Result Value Ref Range   T3, Total 395 (H) 86 - 192 ng/dL  T4, free  Result Value Ref Range   Free T4 2.5 (H) 0.8 - 1.4 ng/dL  TSH  Result Value Ref Range   TSH 0.01 (L) mIU/L   See HPI   Assessment/Plan:  Saraih Lorton is a 19 y.o. female with hyperthyroidism symptoms change in energy level, heat / cold intolerance, nervousness and tremors and tongue fasciulations , likely due to Graves Disease.  She is positive for TSI antibody which confirms graves disease. She is currently on Methimazole therapy but not taking consistently at this time.   1. Hyperthyroidism 2. Thyromegaly 3. Tachycardia 4. Tremor 5. Tongue fasciculation  -Discussed pituitary/thyroid axis and explained causes of  hyperthyroidism, including Graves disease and Hasimoto's thyroiditis.   -Discussed treatment options including medical therapy with methimazole and beta blocker, RAI ablation, and thyroidectomy.  Reviewed side effects/long-term complications of each of these. -Will obtain TSH, FT4, and T3 today as baseline.  Will also obtain Thyroid stimulating immunoglobulin, Thyroid peroxidase antibody, and Thyroglobulin antibody to determine etiology.   -Currently on 5 mg of Methimazole BID.  Will obtain baseline AST/ALT and CBC with Differential prior to starting methimazole. Discussed side effects including rash/hives, arthralgias, and rarely agranulocytosis.  Advised family to stop methimazole and contact me if she  develops fever without a source and mouth ulcers. - start atenolol 25 mg daily to help with hyperthyroid symptoms until methimazole takes effect and TFTs normalize.  Advised to hold this if resting HR is below 100 -Growth chart reviewed with family -Contact information provided and questions answered.      Follow-up:   2 weeks.   Medical decision-making:  > 60 spent today reviewing the medical chart, counseling the patient/family, and documenting today's visit.   Hermenia Bers,  FNP-C  Pediatric Specialist  [address] [place]  [place], [address]  Tele: [phone]

## 2019-03-10 NOTE — Patient Instructions (Signed)
- 5 mg of methimazole twice per day  - 25 mg of atenolol every morning if HR is over 100  - No exercise if HR is over 100  - If you develop nausea, vomiting, change in level of consciousness then go to ER immediately      Hyperthyroidism  Hyperthyroidism is when the thyroid gland is too active (overactive). The thyroid gland is a small gland located in the lower front part of the neck, just in front of the windpipe (trachea). This gland makes hormones that help control how the body uses food for energy (metabolism) as well as how the heart and brain function. These hormones also play a role in keeping your bones strong. When the thyroid is overactive, it produces too much of a hormone called thyroxine. What are the causes? This condition may be caused by:  Graves' disease. This is a disorder in which the body's disease-fighting system (immune system) attacks the thyroid gland. This is the most common cause.  Inflammation of the thyroid gland.  A tumor in the thyroid gland.  Use of certain medicines, including: ? Prescription thyroid hormone replacement. ? Herbal supplements that mimic thyroid hormones. ? Amiodarone therapy.  Solid or fluid-filled lumps within your thyroid gland (thyroid nodules).  Taking in a large amount of iodine from foods or medicines. What increases the risk? You are more likely to develop this condition if:  You are female.  You have a family history of thyroid conditions.  You smoke tobacco.  You use a medicine called lithium.  You take medicines that affect the immune system (immunosuppressants). What are the signs or symptoms? Symptoms of this condition include:  Nervousness.  Inability to tolerate heat.  Unexplained weight loss.  Diarrhea.  Change in the texture of hair or skin.  Heart skipping beats or making extra beats.  Rapid heart rate.  Loss of menstruation.  Shaky hands.  Fatigue.  Restlessness.  Sleep  problems.  Enlarged thyroid gland or a lump in the thyroid (nodule). You may also have symptoms of Graves' disease, which may include:  Protruding eyes.  Dry eyes.  Red or swollen eyes.  Problems with vision. How is this diagnosed? This condition may be diagnosed based on:  Your symptoms and medical history.  A physical exam.  Blood tests.  Thyroid ultrasound. This test involves using sound waves to produce images of the thyroid gland.  A thyroid scan. A radioactive substance is injected into a vein, and images show how much iodine is present in the thyroid.  Radioactive iodine uptake test (RAIU). A small amount of radioactive iodine is given by mouth to see how much iodine the thyroid absorbs after a certain amount of time. How is this treated? Treatment depends on the cause and severity of the condition. Treatment may include:  Medicines to reduce the amount of thyroid hormone your body makes.  Radioactive iodine treatment (radioiodine therapy). This involves swallowing a small dose of radioactive iodine, in capsule or liquid form, to kill thyroid cells.  Surgery to remove part or all of your thyroid gland. You may need to take thyroid hormone replacement medicine for the rest of your life after thyroid surgery.  Medicines to help manage your symptoms. Follow these instructions at home:   Take over-the-counter and prescription medicines only as told by your health care provider.  Do not use any products that contain nicotine or tobacco, such as cigarettes and e-cigarettes. If you need help quitting, ask your health care provider.  Follow any instructions from your health care provider about diet. You may be instructed to limit foods that contain iodine.  Keep all follow-up visits as told by your health care provider. This is important. ? You will need to have blood tests regularly so that your health care provider can monitor your condition. Contact a health care  provider if:  Your symptoms do not get better with treatment.  You have a fever.  You are taking thyroid hormone replacement medicine and you: ? Have symptoms of depression. ? Feel like you are tired all the time. ? Gain weight. Get help right away if:  You have chest pain.  You have decreased alertness or a change in your awareness.  You have abdominal pain.  You feel dizzy.  You have a rapid heartbeat.  You have an irregular heartbeat.  You have difficulty breathing. Summary  The thyroid gland is a small gland located in the lower front part of the neck, just in front of the windpipe (trachea).  Hyperthyroidism is when the thyroid gland is too active (overactive) and produces too much of a hormone called thyroxine.  The most common cause is Graves' disease, a disorder in which your immune system attacks the thyroid gland.  Hyperthyroidism can cause various symptoms, such as unexplained weight loss, nervousness, inability to tolerate heat, or changes in your heartbeat.  Treatment may include medicine to reduce the amount of thyroid hormone your body makes, radioiodine therapy, surgery, or medicines to manage symptoms. This information is not intended to replace advice given to you by your health care provider. Make sure you discuss any questions you have with your health care provider. Document Revised: 12/27/2016 Document Reviewed: 12/25/2016 Elsevier Patient Education  2020 [employer].

## 2019-03-11 ENCOUNTER — Other Ambulatory Visit (INDEPENDENT_AMBULATORY_CARE_PROVIDER_SITE_OTHER): Payer: Self-pay | Admitting: Family

## 2019-03-11 LAB — T3: T3, Total: 328 ng/dL — ABNORMAL HIGH (ref 86–192)

## 2019-03-11 LAB — T4, FREE: Free T4: 2.7 ng/dL — ABNORMAL HIGH (ref 0.8–1.4)

## 2019-03-11 LAB — T4: T4, Total: 14.9 ug/dL — ABNORMAL HIGH (ref 5.3–11.7)

## 2019-03-11 LAB — TSH: TSH: 0.01 mIU/L — ABNORMAL LOW

## 2019-03-11 MED ORDER — METHIMAZOLE 10 MG PO TABS: 15.0000 mg | ORAL_TABLET | Freq: Three times a day (TID) | ORAL | 1 refills | Status: DC

## 2019-03-12 ENCOUNTER — Encounter (INDEPENDENT_AMBULATORY_CARE_PROVIDER_SITE_OTHER): Payer: Self-pay

## 2019-03-16 ENCOUNTER — Encounter: Payer: Self-pay | Admitting: Pediatrics

## 2019-03-16 ENCOUNTER — Other Ambulatory Visit: Payer: Self-pay | Admitting: Pediatrics

## 2019-03-16 ENCOUNTER — Ambulatory Visit (INDEPENDENT_AMBULATORY_CARE_PROVIDER_SITE_OTHER): Payer: Medicaid Other | Admitting: Pediatrics

## 2019-03-16 ENCOUNTER — Other Ambulatory Visit: Payer: Self-pay

## 2019-03-16 VITALS — BP 110/64 | Ht 64.0 in | Wt 123.8 lb

## 2019-03-16 DIAGNOSIS — Z0001 Encounter for general adult medical examination with abnormal findings: Secondary | ICD-10-CM | POA: Diagnosis not present

## 2019-03-16 DIAGNOSIS — E0590 Thyrotoxicosis, unspecified without thyrotoxic crisis or storm: Secondary | ICD-10-CM | POA: Diagnosis not present

## 2019-03-16 DIAGNOSIS — Z79899 Other long term (current) drug therapy: Secondary | ICD-10-CM | POA: Diagnosis not present

## 2019-03-16 DIAGNOSIS — Z68.41 Body mass index (BMI) pediatric, 5th percentile to less than 85th percentile for age: Secondary | ICD-10-CM | POA: Diagnosis not present

## 2019-03-16 DIAGNOSIS — Z0000 Encounter for general adult medical examination without abnormal findings: Secondary | ICD-10-CM

## 2019-03-16 MED ORDER — FERROUS FUMARATE 325 (106 FE) MG PO TABS: 1.0000 | ORAL_TABLET | Freq: Every day | ORAL | 3 refills | Status: DC

## 2019-03-16 MED ORDER — AMPHETAMINE-DEXTROAMPHET ER 30 MG PO CP24: 60.0000 mg | ORAL_CAPSULE | Freq: Every day | ORAL | 0 refills | Status: DC

## 2019-03-16 NOTE — Progress Notes (Signed)
Subjective:     [name] [name] is a 19 y.o. female and is here for a comprehensive physical exam. The patient reports no problems. She was recently diagnosed with hyperthyroidism and has started medication therapy managed by endocrinology.  Social History   Socioeconomic History  . Marital status: Single    Spouse name: Not on file  . Number of children: Not on file  . Years of education: Not on file  . Highest education level: Not on file  Occupational History  . Not on file  Tobacco Use  . Smoking status: Passive Smoke Exposure - Never Smoker  . Smokeless tobacco: Never Used  Substance and Sexual Activity  . Alcohol use: No  . Drug use: No  . Sexual activity: Never    Birth control/protection: Abstinence  Other Topics Concern  . Not on file  Social History Narrative   [employer]    12th grade   Likes drawing.      Pt lives with grandma, grandpa, three brothers, sister-in-law and niece.    Social Determinants of Health   Financial Resource Strain: Low Risk   . Difficulty of Paying Living Expenses: Not on file  Food Insecurity: Unknown  . Worried About Programme researcher, broadcasting/film/video in the Last Year: Not on file  . Ran Out of Food in the Last Year: Not on file  Transportation Needs: Unknown  . Lack of Transportation (Medical): Not on file  . Lack of Transportation (Non-Medical): Not on file  Physical Activity:   . Days of Exercise per Week: Not on file  . Minutes of Exercise per Session: Not on file  Stress:   . Feeling of Stress : Not on file  Social Connections:   . Frequency of Communication with Friends and Family: Not on file  . Frequency of Social Gatherings with Friends and Family: Not on file  . Attends Religious Services: Not on file  . Active Member of Clubs or Organizations: Not on file  . Attends Banker Meetings: Not on file  . Marital Status: Not on file  Intimate Partner Violence:   . Fear of Current or Ex-Partner: Not on file   . Emotionally Abused: Not on file  . Physically Abused: Not on file  . Sexually Abused: Not on file   Health Maintenance  Topic Date Due  . TETANUS/TDAP  08/28/2027  . INFLUENZA VACCINE  Completed  . HIV Screening  Completed    The following portions of the patient's history were reviewed and updated as appropriate: allergies, current medications, past family history, past medical history, past social history, past surgical history and problem list.  Review of Systems Pertinent items are noted in HPI.   Objective:    BP 110/64   Ht 5\' 4"  (1.626 m)   Wt 123 lb 12.8 oz (56.2 kg)   LMP 03/01/2019 (Within Weeks)   BMI 21.25 kg/m  General appearance: alert, cooperative, appears stated age and no distress Head: Normocephalic, without obvious abnormality, atraumatic Eyes: conjunctivae/corneas clear. PERRL, EOM's intact. Fundi benign. Ears: normal TM's and external ear canals both ears Nose: Nares normal. Septum midline. Mucosa normal. No drainage or sinus tenderness. Throat: lips, mucosa, and tongue normal; teeth and gums normal Neck: no adenopathy, no carotid bruit, no JVD, supple, symmetrical, trachea midline and thyroid not enlarged, symmetric, no tenderness/mass/nodules Lungs: clear to auscultation bilaterally Heart: regular rate and rhythm, S1, S2 normal, no murmur, click, rub or gallop and normal apical impulse Abdomen: soft,  non-tender; bowel sounds normal; no masses,  no organomegaly Extremities: extremities normal, atraumatic, no cyanosis or edema Pulses: 2+ and symmetric Skin: Skin color, texture, turgor normal. No rashes or lesions Neurologic: Grossly normal    Assessment:    Healthy female exam.     Plan:     ADHD meds refilled after normal weight and Blood pressure. Doing well on present dose.  See After Visit Summary for Counseling Recommendations

## 2019-03-16 NOTE — Patient Instructions (Addendum)
[facility] [address] [phone]  Preventive Care 58-20 Years Old, Female Preventive care refers to lifestyle choices and visits with your health care provider that can promote health and wellness. At this stage in your life, you may start seeing a primary care physician instead of a pediatrician. Your health care is now your responsibility. Preventive care for young adults includes:  A yearly physical exam. This is also called an annual wellness visit.  Regular dental and eye exams.  Immunizations.  Screening for certain conditions.  Healthy lifestyle choices, such as diet and exercise. What can I expect for my preventive care visit? Physical exam Your health care provider may check:  Height and weight. These may be used to calculate body mass index (BMI), which is a measurement that tells if you are at a healthy weight.  Heart rate and blood pressure.  Body temperature. Counseling Your health care provider may ask you questions about:  Past medical problems and family medical history.  Alcohol, tobacco, and drug use.  Home and relationship well-being.  Access to firearms.  Emotional well-being.  Diet, exercise, and sleep habits.  Sexual activity and sexual health.  Method of birth control.  Menstrual cycle.  Pregnancy history. What immunizations do I need? Influenza (flu) vaccine  This is recommended every year. Tetanus, diphtheria, and pertussis (Tdap) vaccine  You may need a Td booster every 10 years. Varicella (chickenpox) vaccine  You may need this vaccine if you have not already been vaccinated. Human papillomavirus (HPV) vaccine  If recommended by your health care provider, you may need three doses over 6 months. Measles, mumps, and rubella (MMR) vaccine  You may need at least one dose of MMR. You may also need a second dose. Meningococcal conjugate (MenACWY) vaccine  One dose is recommended if you are 28-20  years old and a Market researcher living in a residence hall, or if you have one of several medical conditions. You may also need additional booster doses. Pneumococcal conjugate (PCV13) vaccine  You may need this if you have certain conditions and were not previously vaccinated. Pneumococcal polysaccharide (PPSV23) vaccine  You may need one or two doses if you smoke cigarettes or if you have certain conditions. Hepatitis A vaccine  You may need this if you have certain conditions or if you travel or work in places where you may be exposed to hepatitis A. Hepatitis B vaccine  You may need this if you have certain conditions or if you travel or work in places where you may be exposed to hepatitis B. Haemophilus influenzae type b (Hib) vaccine  You may need this if you have certain risk factors. You may receive vaccines as individual doses or as more than one vaccine together in one shot (combination vaccines). Talk with your health care provider about the risks and benefits of combination vaccines. What tests do I need? Blood tests  Lipid and cholesterol levels. These may be checked every 5 years starting at age 44.  Hepatitis C test.  Hepatitis B test. Screening  Pelvic exam and Pap test. This may be done every 3 years starting at age 30.  Sexually transmitted disease (STD) testing, if you are at risk.  BRCA-related cancer screening. This may be done if you have a family history of breast, ovarian, tubal, or peritoneal cancers. Other tests  Tuberculosis skin test.  Vision and hearing tests.  Skin exam.  Breast exam. Follow these instructions at home: Eating and drinking  Eat a diet that includes fresh fruits and vegetables, whole grains, lean protein, and low-fat dairy products.  Drink enough fluid to keep your urine pale yellow.  Do not drink alcohol if: ? Your health care provider tells you not to drink. ? You are pregnant, may be pregnant, or are planning  to become pregnant. ? You are under the legal drinking age. In the [place] the legal drinking age is 47.  If you drink alcohol: ? Limit how much you have to 0-1 drink a day. ? Be aware of how much alcohol is in your drink. In the [place] one drink equals one 12 oz bottle of beer (355 mL), one 5 oz glass of wine (148 mL), or one 1 oz glass of hard liquor (44 mL). Lifestyle  Take daily care of your teeth and gums.  Stay active. Exercise at least 30 minutes 5 or more days of the week.  Do not use any products that contain nicotine or tobacco, such as cigarettes, e-cigarettes, and chewing tobacco. If you need help quitting, ask your health care provider.  Do not use drugs.  If you are sexually active, practice safe sex. Use a condom or other form of birth control (contraception) in order to prevent pregnancy and STIs (sexually transmitted infections). If you plan to become pregnant, see your health care provider for a pre-conception visit.  Find healthy ways to cope with stress, such as: ? Meditation, yoga, or listening to music. ? Journaling. ? Talking to a trusted person. ? Spending time with friends and family. Safety  Always wear your seat belt while driving or riding in a vehicle.  Do not drive if you have been drinking alcohol. Do not ride with someone who has been drinking.  Do not drive when you are tired or distracted. Do not text while driving.  Wear a helmet and other protective equipment during sports activities.  If you have firearms in your house, make sure you follow all gun safety procedures.  Seek help if you have been bullied, physically abused, or sexually abused.  Use the Internet responsibly to avoid dangers such as online bullying and online sex predators. What's next?  Go to your health care provider once a year for a well check visit.  Ask your health care provider how often you should have your eyes and teeth checked.  Stay up to date on all  vaccines. This information is not intended to replace advice given to you by your health care provider. Make sure you discuss any questions you have with your health care provider. Document Revised: 01/08/2018 Document Reviewed: 01/08/2018 Elsevier Patient Education  2020 [employer].

## 2019-03-18 ENCOUNTER — Ambulatory Visit (INDEPENDENT_AMBULATORY_CARE_PROVIDER_SITE_OTHER): Payer: Medicaid Other | Admitting: Family

## 2019-03-19 ENCOUNTER — Encounter (INDEPENDENT_AMBULATORY_CARE_PROVIDER_SITE_OTHER): Payer: Self-pay

## 2019-03-23 ENCOUNTER — Encounter (INDEPENDENT_AMBULATORY_CARE_PROVIDER_SITE_OTHER): Payer: Self-pay | Admitting: Family

## 2019-03-23 ENCOUNTER — Other Ambulatory Visit: Payer: Self-pay

## 2019-03-23 ENCOUNTER — Ambulatory Visit (INDEPENDENT_AMBULATORY_CARE_PROVIDER_SITE_OTHER): Payer: Medicaid Other | Admitting: Family

## 2019-03-23 VITALS — BP 140/90 | HR 134 | Wt 126.8 lb

## 2019-03-23 DIAGNOSIS — E0500 Thyrotoxicosis with diffuse goiter without thyrotoxic crisis or storm: Secondary | ICD-10-CM | POA: Diagnosis not present

## 2019-03-23 DIAGNOSIS — E0590 Thyrotoxicosis, unspecified without thyrotoxic crisis or storm: Secondary | ICD-10-CM | POA: Diagnosis not present

## 2019-03-23 DIAGNOSIS — R000 Tachycardia, unspecified: Secondary | ICD-10-CM

## 2019-03-23 DIAGNOSIS — R253 Fasciculation: Secondary | ICD-10-CM

## 2019-03-23 DIAGNOSIS — R251 Tremor, unspecified: Secondary | ICD-10-CM | POA: Diagnosis not present

## 2019-03-23 NOTE — Progress Notes (Addendum)
Pediatric Endocrinology Consultation Follow Up Visit  [name] [name], [name] 2000/10/31  [name] Anna, NP  Chief Complaint: Hyperthyroid/Graves  History obtained from: [name] [name] [name], and review of records from PCP  HPI: [name] [name] [name]  is a 19 y.o. female being seen in consultation at the request of  [name], [name], NP for evaluation of the above concerns.   1.  [name] was seen by her adolescent medicine on 01/2018 where she was noted to have lmenorrhagia so labs were ordered. Her TSH resulted at 0.01 with a FT4 of 1.9. [name] Resides, NP contacted me at that time and I advised to repeat labs with thyroid antibodies. Her second set of labs showed elevated TSI that is positive for Graves disease, her TSH had also remained at 0.01 with a higher FT4 of 2.5 and elevated T3 of 395. She was started on 5 mg of Methimazole BID and 25 mg of Atenolol daily for symptom control.  [facility] for further evaluation.    2. Since her last visit to clinic on 03/09/2018, she has been well.   She is taking 15 mg of Methimazole twice per day. She occasionally forgets at night, about twice per week. She is going to set alarm to remind herself. She is taking the atenolol most days because her heart rate is over 100. She states that she feels "so much better" when she does take the methimazole.    Thyroid symptoms: Heat or cold intolerance: + hot but "not as bad" Weight changes: denies Energy level: + more energy Sleep: + poor sleep Skin changes: denies Constipation/Diarrhea: denies Difficulty swallowing: denies Neck swelling: Denies Periods regular: + menorrhagia  Tremor: + tremors but "not as much" Palpitations: + tachycardia.    ROS: All systems reviewed with pertinent positives listed below; otherwise negative. Constitutional: Weight as above.  Poor sleep  HEENT: No vision changes. No neck pain. No difficulty swallowing.  Respiratory: No increased work of breathing  currently Cardiac: + tachycardia. No palpitations.  GI: No constipation or diarrhea, no stomach aches.  GU: No nocturia. No polyuria.  Musculoskeletal: No joint deformity Neuro: Normal affect. + tremors and anxiety.  Endocrine: As above   Past Medical History:  Past Medical History:  Diagnosis Date  . ADHD (attention deficit hyperactivity disorder) 12/31/2010  . Hearing loss   . 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 Delivered at term Discharged home with mom  Meds: Outpatient Encounter Medications as of 03/23/2019  Medication Sig  . acetaminophen (TYLENOL) 325 MG tablet Take 2 tablets (650 mg total) by mouth every 4 (four) hours as needed for mild pain.  Derrill Memo ON 04/18/2019] amphetamine-dextroamphetamine (ADDERALL XR) 30 MG 24 hr capsule Take 2 capsules (60 mg total) by mouth daily.  Derrill Memo ON 05/19/2019] amphetamine-dextroamphetamine (ADDERALL XR) 30 MG 24 hr capsule Take 2 capsules (60 mg total) by mouth daily.  Marland Kitchen atenolol (TENORMIN) 25 MG tablet Take 1 tablet (25 mg total) by mouth daily. Take in the morning if your heart rate is greater than 100.  . ferrous fumarate (HEMOCYTE - 106 MG FE) 325 (106 Fe) MG TABS tablet Take 1 tablet (106 mg of iron total) by mouth daily.  . methimazole (TAPAZOLE) 10 MG tablet Take 1.5 tablets (15 mg total) by mouth 3 (three) times daily.  Marland Kitchen amphetamine-dextroamphetamine (ADDERALL XR) 30 MG 24 hr capsule Take 2 capsules (60 mg total) by mouth daily.  Marland Kitchen  amphetamine-dextroamphetamine (ADDERALL XR) 30 MG 24 hr capsule Take 2 capsules (60 mg total) by mouth daily.  Melene Muller ON 06/18/2019] amphetamine-dextroamphetamine (ADDERALL XR) 30 MG 24 hr capsule Take 2 capsules (60 mg total) by mouth daily. (Patient not taking: Reported on 03/23/2019)  . norethindrone (MICRONOR) 0.35 MG tablet Take 1 tablet (0.35 mg total) by mouth daily. (Patient not taking: Reported on 03/10/2019)   No  facility-administered encounter medications on file as of 03/23/2019.    Allergies: No Known Allergies  Surgical History: Past Surgical History:  Procedure Laterality Date  . TYMPANOSTOMY TUBE PLACEMENT     twice    Family History:  Family History  Problem Relation Age of Onset  . ADD / ADHD Brother   . ADD / ADHD Brother   . Diabetes Brother   . ADD / ADHD Brother   . Allergies Brother   . Diabetes Maternal Grandmother   . Hypertension Maternal Grandmother   . Cancer Maternal Grandfather   . Alcohol abuse Neg Hx   . Arthritis Neg Hx   . Asthma Neg Hx   . Birth defects Neg Hx   . COPD Neg Hx   . Depression Neg Hx   . Drug abuse Neg Hx   . Early death Neg Hx   . Hearing loss Neg Hx   . Heart disease Neg Hx   . Hyperlipidemia Neg Hx   . Kidney disease Neg Hx   . Learning disabilities Neg Hx   . Mental illness Neg Hx   . Mental retardation Neg Hx   . Miscarriages / Stillbirths Neg Hx   . Stroke Neg Hx   . Vision loss Neg Hx   . Varicose Veins Neg Hx      Social History: Lives with: Grandmother, 3 brother and 2 nieces.    Physical Exam:  Vitals:   03/23/19 1005  BP: 140/90  Pulse: (!) 134  Weight: 126 lb 12.8 oz (57.5 kg)    Body mass index: body mass index is 21.77 kg/m. Blood pressure percentiles are not available for patients who are 18 years or older.  Wt Readings from Last 3 Encounters:  03/23/19 126 lb 12.8 oz (57.5 kg) (49 %, Z= -0.01)*  03/16/19 123 lb 12.8 oz (56.2 kg) (44 %, Z= -0.16)*  03/10/19 125 lb 12.8 oz (57.1 kg) (48 %, Z= -0.06)*   * Growth percentiles are based on CDC (Girls, 2-20 Years) data.   Ht Readings from Last 3 Encounters:  03/16/19 5\' 4"  (1.626 m) (46 %, Z= -0.11)*  03/10/19 5' 4.13" (1.629 m) (48 %, Z= -0.06)*  02/25/19 5\' 4"  (1.626 m) (46 %, Z= -0.11)*   * Growth percentiles are based on CDC (Girls, 2-20 Years) data.     49 %ile (Z= -0.01) based on CDC (Girls, 2-20 Years) weight-for-age data using vitals from  03/23/2019. No height on file for this encounter. 52 %ile (Z= 0.05) based on CDC (Girls, 2-20 Years) BMI-for-age data using weight from 03/23/2019 and height from 03/16/2019.  General: Well developed, well nourished female in no acute distress.  Alert and oriented.  Head: Normocephalic, atraumatic.   Eyes:  Pupils equal and round. EOMI.   Sclera white.  No eye drainage.   Ears/Nose/Mouth/Throat: Nares patent, no nasal drainage.  Normal dentition, mucous membranes moist.  + tongue fasciculations  Neck: supple, no cervical lymphadenopathy, + thyromegaly. No nodules, no tenderness.  Cardiovascular: + tachycardia (114 bpm), normal S1/S2, no murmurs Respiratory: No increased work of breathing.  Lungs clear to auscultation bilaterally.  No wheezes. Abdomen: soft, nontender, nondistended. Normal bowel sounds.  No appreciable masses  Extremities: warm, well perfused, cap refill < 2 sec.   Musculoskeletal: Normal muscle mass.  Normal strength Skin: warm, dry.  No rash or lesions.  Neurologic: alert and oriented, normal speech, + tremor    Laboratory Evaluation: Results for orders placed or performed in visit on 03/10/19  T4, free  Result Value Ref Range   Free T4 2.7 (H) 0.8 - 1.4 ng/dL  T3  Result Value Ref Range   T3, Total 328 (H) 86 - 192 ng/dL  TSH  Result Value Ref Range   TSH 0.01 (L) mIU/L  T4  Result Value Ref Range   T4, Total 14.9 (H) 5.3 - 11.7 mcg/dL      Assessment/Plan:  [name] [name] [name] is a 19 y.o. female with hyperthyroidism/Graves disease on 15 mg of methimazole BID. She remains clinically hyperthyroid but feels like symptoms are improving.    1. Hyperthyroidism 2. Thyromegaly 3. Tachycardia 4. Tremor 5. Tongue fasciculation  -Discussed pituitary/thyroid axis and explained causes of hyperthyroidism, including Graves disease and Hasimoto's thyroiditis.   - TSH, FT4, t3 ordered  - 15 mg of Methimazole BID and will increase after labs if needed.  - 25 mg of Atenolol  daily for symptom management. Do not take if HR is <100.  - Reviewed growth chart.  - Discussed importance of not exercising when HR is greater then 100.  - Reviewed signs of thyroid storm.      Follow-up:   2 weeks.   Medical decision-making:  >35 spent today reviewing the medical chart, counseling the patient/family, and documenting today's visit.    Gretchen Short,  FNP-C  [facility]  [address]  [place], [address]  Tele: [phone]

## 2019-03-23 NOTE — Patient Instructions (Addendum)
-   15 mg of methimazole twice a day  - 25 mg of atenolol when heart rate is greater then 100  - No activity if heart rate is great then 100   Labs today.   2 week follow up . 04/07/2019 at 845

## 2019-03-24 ENCOUNTER — Other Ambulatory Visit (INDEPENDENT_AMBULATORY_CARE_PROVIDER_SITE_OTHER): Payer: Self-pay | Admitting: Family

## 2019-03-24 LAB — CBC
HCT: 31.6 % — ABNORMAL LOW (ref 35.0–45.0)
Hemoglobin: 9.7 g/dL — ABNORMAL LOW (ref 11.7–15.5)
MCH: 21.2 pg — ABNORMAL LOW (ref 27.0–33.0)
MCHC: 30.7 g/dL — ABNORMAL LOW (ref 32.0–36.0)
MCV: 69 fL — ABNORMAL LOW (ref 80.0–100.0)
MPV: 10.8 fL (ref 7.5–12.5)
Platelets: 313 10*3/uL (ref 140–400)
RBC: 4.58 10*6/uL (ref 3.80–5.10)
RDW: 15.2 % — ABNORMAL HIGH (ref 11.0–15.0)
WBC: 5.7 10*3/uL (ref 3.8–10.8)

## 2019-03-24 LAB — T4: T4, Total: 8.9 ug/dL (ref 5.3–11.7)

## 2019-03-24 LAB — TSH: TSH: <0.01 mIU/L — ABNORMAL LOW

## 2019-03-24 LAB — T4, FREE: Free T4: 1.4 ng/dL (ref 0.8–1.4)

## 2019-03-24 LAB — T3: T3, Total: 200 ng/dL — ABNORMAL HIGH (ref 86–192)

## 2019-03-24 MED ORDER — METHIMAZOLE 10 MG PO TABS: 15.0000 mg | ORAL_TABLET | Freq: Two times a day (BID) | ORAL | 1 refills | Status: DC

## 2019-04-07 ENCOUNTER — Encounter (INDEPENDENT_AMBULATORY_CARE_PROVIDER_SITE_OTHER): Payer: Self-pay | Admitting: Family

## 2019-04-07 ENCOUNTER — Ambulatory Visit (INDEPENDENT_AMBULATORY_CARE_PROVIDER_SITE_OTHER): Payer: Medicaid Other | Admitting: Family

## 2019-04-07 ENCOUNTER — Other Ambulatory Visit: Payer: Self-pay

## 2019-04-07 VITALS — BP 118/80 | HR 105 | Ht 64.02 in | Wt 121.8 lb

## 2019-04-07 DIAGNOSIS — E0590 Thyrotoxicosis, unspecified without thyrotoxic crisis or storm: Secondary | ICD-10-CM | POA: Diagnosis not present

## 2019-04-07 DIAGNOSIS — R253 Fasciculation: Secondary | ICD-10-CM

## 2019-04-07 DIAGNOSIS — R251 Tremor, unspecified: Secondary | ICD-10-CM

## 2019-04-07 DIAGNOSIS — E0500 Thyrotoxicosis with diffuse goiter without thyrotoxic crisis or storm: Secondary | ICD-10-CM | POA: Diagnosis not present

## 2019-04-07 DIAGNOSIS — R000 Tachycardia, unspecified: Secondary | ICD-10-CM | POA: Diagnosis not present

## 2019-04-07 LAB — CBC
HCT: 34 % — ABNORMAL LOW (ref 35.0–45.0)
Hemoglobin: 10.1 g/dL — ABNORMAL LOW (ref 11.7–15.5)
MCH: 20.7 pg — ABNORMAL LOW (ref 27.0–33.0)
MCHC: 29.7 g/dL — ABNORMAL LOW (ref 32.0–36.0)
MCV: 69.7 fL — ABNORMAL LOW (ref 80.0–100.0)
MPV: 10.5 fL (ref 7.5–12.5)
Platelets: 296 10*3/uL (ref 140–400)
RBC: 4.88 10*6/uL (ref 3.80–5.10)
RDW: 16.5 % — ABNORMAL HIGH (ref 11.0–15.0)
WBC: 4.5 10*3/uL (ref 3.8–10.8)

## 2019-04-07 LAB — T3: T3, Total: 225 ng/dL — ABNORMAL HIGH (ref 86–192)

## 2019-04-07 LAB — TSH: TSH: 0.01 mIU/L — ABNORMAL LOW

## 2019-04-07 LAB — T4, FREE: Free T4: 1.5 ng/dL — ABNORMAL HIGH (ref 0.8–1.4)

## 2019-04-07 NOTE — Patient Instructions (Signed)
-   15 mg of Methimazole BID  - Atenolol 25 mg if HR is higher then 100  - NO exercise if HR is >100    - TAKE YOUR MEDICATION   - follow up in 3 weeks.

## 2019-04-07 NOTE — Progress Notes (Addendum)
Pediatric Endocrinology Consultation Follow Up Visit  [name], [name] [birth year]  [name] Anna, NP  Chief Complaint: Hyperthyroid/Graves  History obtained from: [name] [name], and review of records from PCP  HPI: [name] [name]  is a 19 y.o. female being seen in consultation at the request of  [name], [name], NP for evaluation of the above concerns.   1.  [name] [name] was seen by her adolescent medicine on 01/2018 where she was noted to have lmenorrhagia so labs were ordered. Her TSH resulted at 0.01 with a FT4 of 1.9. [name] Resides, NP contacted me at that time and I advised to repeat labs with thyroid antibodies. Her second set of labs showed elevated TSI that is positive for Graves disease, her TSH had also remained at 0.01 with a higher FT4 of 2.5 and elevated T3 of 395. She was started on 5 mg of Methimazole BID and 25 mg of Atenolol daily for symptom control.  [facility] for further evaluation.    2. Since her last visit to clinic on 02/2018, she has been well.   She is checking her HR every morning, usually 110-140. She takes the Atenolol about every other day. She is taking 15 mg of Methimazole twice per day. She reports missing 2-3 doses in the past 2 weeks. She thinks she is feeling "so much better". Also report that her menstrual cycle was "better", no cramping as bad or bleeding as heavy.    Thyroid symptoms: Heat or cold intolerance:Denies.  Weight changes: denies Energy level: + more energy Sleep: + improving.  Skin changes: denies Constipation/Diarrhea: denies Difficulty swallowing: denies Neck swelling: Denies Periods regular: + menorrhagia  Tremor: + tremors, worse on days that she forgets medication  Palpitations: + tachycardia, about half th eweek.    ROS: All systems reviewed with pertinent positives listed below; otherwise negative. Constitutional: Weight as above.  Sleep improving.  HEENT: No vision changes. No neck pain. No  difficulty swallowing.  Respiratory: No increased work of breathing currently Cardiac: + tachycardia. No palpitations.  GI: No constipation or diarrhea, no stomach aches.  GU: No nocturia. No polyuria.  Musculoskeletal: No joint deformity Neuro: Normal affect. +mild  tremors  Endocrine: As above   Past Medical History:  Past Medical History:  Diagnosis Date  . ADHD (attention deficit hyperactivity disorder) 12/31/2010  . Hearing loss   . 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 Delivered at term Discharged home with mom  Meds: Outpatient Encounter Medications as of 04/07/2019  Medication Sig  . acetaminophen (TYLENOL) 325 MG tablet Take 2 tablets (650 mg total) by mouth every 4 (four) hours as needed for mild pain.  Derrill Memo ON 04/18/2019] amphetamine-dextroamphetamine (ADDERALL XR) 30 MG 24 hr capsule Take 2 capsules (60 mg total) by mouth daily.  Derrill Memo ON 05/19/2019] amphetamine-dextroamphetamine (ADDERALL XR) 30 MG 24 hr capsule Take 2 capsules (60 mg total) by mouth daily.  Marland Kitchen atenolol (TENORMIN) 25 MG tablet Take 1 tablet (25 mg total) by mouth daily. Take in the morning if your heart rate is greater than 100.  . ferrous fumarate (HEMOCYTE - 106 MG FE) 325 (106 Fe) MG TABS tablet Take 1 tablet (106 mg of iron total) by mouth daily.  . methimazole (TAPAZOLE) 10 MG tablet Take 1.5 tablets (15 mg total) by mouth 2 (two) times daily.  Marland Kitchen amphetamine-dextroamphetamine (ADDERALL XR) 30 MG 24 hr capsule Take 2 capsules (  60 mg total) by mouth daily.  Marland Kitchen amphetamine-dextroamphetamine (ADDERALL XR) 30 MG 24 hr capsule Take 2 capsules (60 mg total) by mouth daily.  Melene Muller ON 06/18/2019] amphetamine-dextroamphetamine (ADDERALL XR) 30 MG 24 hr capsule Take 2 capsules (60 mg total) by mouth daily. (Patient not taking: Reported on 03/23/2019)  . norethindrone (MICRONOR) 0.35 MG tablet Take 1 tablet (0.35 mg total) by mouth  daily. (Patient not taking: Reported on 03/10/2019)   No facility-administered encounter medications on file as of 04/07/2019.    Allergies: No Known Allergies  Surgical History: Past Surgical History:  Procedure Laterality Date  . TYMPANOSTOMY TUBE PLACEMENT     twice    Family History:  Family History  Problem Relation Age of Onset  . ADD / ADHD Brother   . ADD / ADHD Brother   . Diabetes Brother   . ADD / ADHD Brother   . Allergies Brother   . Diabetes Maternal Grandmother   . Hypertension Maternal Grandmother   . Cancer Maternal Grandfather   . Alcohol abuse Neg Hx   . Arthritis Neg Hx   . Asthma Neg Hx   . Birth defects Neg Hx   . COPD Neg Hx   . Depression Neg Hx   . Drug abuse Neg Hx   . Early death Neg Hx   . Hearing loss Neg Hx   . Heart disease Neg Hx   . Hyperlipidemia Neg Hx   . Kidney disease Neg Hx   . Learning disabilities Neg Hx   . Mental illness Neg Hx   . Mental retardation Neg Hx   . Miscarriages / Stillbirths Neg Hx   . Stroke Neg Hx   . Vision loss Neg Hx   . Varicose Veins Neg Hx      Social History: Lives with: Grandmother, 3 brother and 2 nieces.    Physical Exam:  Vitals:   04/07/19 0829  BP: 118/80  Pulse: (!) 105  Weight: 121 lb 12.8 oz (55.2 kg)  Height: 5' 4.02" (1.626 m)    Body mass index: body mass index is 20.9 kg/m. Blood pressure percentiles are not available for patients who are 18 years or older.  Wt Readings from Last 3 Encounters:  04/07/19 121 lb 12.8 oz (55.2 kg) (39 %, Z= -0.27)*  03/23/19 126 lb 12.8 oz (57.5 kg) (49 %, Z= -0.01)*  03/16/19 123 lb 12.8 oz (56.2 kg) (44 %, Z= -0.16)*   * Growth percentiles are based on CDC (Girls, 2-20 Years) data.   Ht Readings from Last 3 Encounters:  04/07/19 5' 4.02" (1.626 m) (46 %, Z= -0.11)*  03/16/19 5\' 4"  (1.626 m) (46 %, Z= -0.11)*  03/10/19 5' 4.13" (1.629 m) (48 %, Z= -0.06)*   * Growth percentiles are based on CDC (Girls, 2-20 Years) data.     39  %ile (Z= -0.27) based on CDC (Girls, 2-20 Years) weight-for-age data using vitals from 04/07/2019. 46 %ile (Z= -0.11) based on CDC (Girls, 2-20 Years) Stature-for-age data based on Stature recorded on 04/07/2019. 41 %ile (Z= -0.23) based on CDC (Girls, 2-20 Years) BMI-for-age based on BMI available as of 04/07/2019.  General: Well developed, well nourished female in no acute distress.  Alert and oriented.  Head: Normocephalic, atraumatic.   Eyes:  Pupils equal and round. EOMI.   Sclera white.  No eye drainage.   Ears/Nose/Mouth/Throat: Nares patent, no nasal drainage.  Normal dentition, mucous membranes moist.  + tongue fasciculations  Neck: supple, no cervical  lymphadenopathy, + thyromegaly. No nodules, no tenderness.  Cardiovascular: + tachycardia (105 bpm), normal S1/S2, no murmurs Respiratory: No increased work of breathing.  Lungs clear to auscultation bilaterally.  No wheezes. Abdomen: soft, nontender, nondistended. Normal bowel sounds.  No appreciable masses  Extremities: warm, well perfused, cap refill < 2 sec.   Musculoskeletal: Normal muscle mass.  Normal strength Skin: warm, dry.  No rash or lesions.  Neurologic: alert and oriented, normal speech, + tremor    Laboratory Evaluation:     Assessment/Plan:  Rivers Gassmann is a 19 y.o. female with hyperthyroidism/Graves disease on 15 mg of methimazole BID. She remains clinically hyperthyroid but is feeling better overall. Repeat labs today.   1. Hyperthyroidism 2. Thyromegaly 3. Tachycardia 4. Tremor 5. Tongue fasciculation  -Discussed pituitary/thyroid axis and explained causes of hyperthyroidism, including Graves disease and Hasimoto's thyroiditis.   - 15 mg of methimazole BID  - 25 mg of Atenolol daily if HR is >100  - No exercise if HR is >100  - Discussed signs and symptoms of hyperthyroidism.    Follow-up:   3 weeks.   Medical decision-making:  >30 spent today reviewing the medical chart, counseling the  patient/family, and documenting today's visit.     Gretchen Short,  FNP-C  [facility]  [address]  [place], [address]  Tele: [phone]

## 2019-05-04 ENCOUNTER — Other Ambulatory Visit: Payer: Self-pay

## 2019-05-04 ENCOUNTER — Encounter (INDEPENDENT_AMBULATORY_CARE_PROVIDER_SITE_OTHER): Payer: Self-pay | Admitting: Family

## 2019-05-04 ENCOUNTER — Ambulatory Visit (INDEPENDENT_AMBULATORY_CARE_PROVIDER_SITE_OTHER): Payer: Medicaid Other | Admitting: Family

## 2019-05-04 VITALS — BP 142/80 | HR 94 | Wt 128.2 lb

## 2019-05-04 DIAGNOSIS — E0500 Thyrotoxicosis with diffuse goiter without thyrotoxic crisis or storm: Secondary | ICD-10-CM

## 2019-05-04 DIAGNOSIS — R251 Tremor, unspecified: Secondary | ICD-10-CM

## 2019-05-04 DIAGNOSIS — E0590 Thyrotoxicosis, unspecified without thyrotoxic crisis or storm: Secondary | ICD-10-CM

## 2019-05-04 NOTE — Patient Instructions (Addendum)
-   20 mg of Methimazole in the morning and 15 mg at night  - Atenolol once daily if resting HR is >100  - Labs today  - Follow up in 3 weeks.

## 2019-05-04 NOTE — Progress Notes (Signed)
Pediatric Endocrinology Consultation Follow Up Visit  [name], [name] 04/17/2000  Estelle June, NP  Chief Complaint: Hyperthyroid/Graves  History obtained from: [name] [name], and review of records from PCP  HPI: [name] [name]  is a 19 y.o. female being seen in consultation at the request of  [name], Pascal Lux, NP for evaluation of the above concerns.   1.  [name] [name] was seen by her adolescent medicine on 01/2018 where she was noted to have lmenorrhagia so labs were ordered. Her TSH resulted at 0.01 with a FT4 of 1.9. [name], NP contacted me at that time and I advised to repeat labs with thyroid antibodies. Her second set of labs showed elevated TSI that is positive for Graves disease, her TSH had also remained at 0.01 with a higher FT4 of 2.5 and elevated T3 of 395. She was started on 5 mg of Methimazole BID and 25 mg of Atenolol daily for symptom control.  [facility] is [facility] to [facility] ([facility]) for further evaluation.    2. Since her last visit to clinic on 02/2018, she has been well.   She has been busy working, her grandmother was recently hurt so she is taking a lot extra responsibility. Reports that she checks her heart rate most mornings, it is usually 120 or less. She is taking the atenolol about 4-5 days per week. She has missed about 4 days of Methimazole morning dose but never forgets the night dose. She is taking 20 mg in the morning and 15 at night.   She has not been taking iron supplement consistently. She has stopped taking birth OCP  Thyroid symptoms: Heat or cold intolerance: Denies.   Weight changes: denies Energy level: Energy is good.  Sleep: Good most nights.  Skin changes: denies Constipation/Diarrhea: denies Difficulty swallowing: denies Neck swelling: Denies Periods regular: not as heavy  Tremor: + tremors especially when she misses medication.  Palpitations: Denies except if she tries to exercise.    ROS: All systems reviewed with pertinent  positives listed below; otherwise negative. Constitutional: Weight as above.  Feels better overall.  HEENT: No vision changes. No neck pain. No difficulty swallowing.  Respiratory: No increased work of breathing currently Cardiac: intermittent tachycardia. No palpitations.  GI: No constipation or diarrhea, no stomach aches.  GU: No nocturia. No polyuria.  Musculoskeletal: No joint deformity Neuro: Normal affect. +mild  tremors  Endocrine: As above   Past Medical History:  Past Medical History:  Diagnosis Date  . ADHD (attention deficit hyperactivity disorder) 12/31/2010  . Hearing loss   . 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 Delivered at term Discharged home with mom  Meds: Outpatient Encounter Medications as of 05/04/2019  Medication Sig Note  . acetaminophen (TYLENOL) 325 MG tablet Take 2 tablets (650 mg total) by mouth every 4 (four) hours as needed for mild pain.   Marland Kitchen amphetamine-dextroamphetamine (ADDERALL XR) 30 MG 24 hr capsule Take 2 capsules (60 mg total) by mouth daily.   Marland Kitchen atenolol (TENORMIN) 25 MG tablet Take 1 tablet (25 mg total) by mouth daily. Take in the morning if your heart rate is greater than 100.   . ferrous fumarate (HEMOCYTE - 106 MG FE) 325 (106 Fe) MG TABS tablet Take 1 tablet (106 mg of iron total) by mouth daily.   . methimazole (TAPAZOLE) 10 MG tablet Take 1.5 tablets (15 mg total) by mouth 2 (two) times daily. 05/04/2019: 20 mg in  the am, 15 mg in the evening  . amphetamine-dextroamphetamine (ADDERALL XR) 30 MG 24 hr capsule Take 2 capsules (60 mg total) by mouth daily.   Marland Kitchen amphetamine-dextroamphetamine (ADDERALL XR) 30 MG 24 hr capsule Take 2 capsules (60 mg total) by mouth daily.   Melene Muller ON 05/19/2019] amphetamine-dextroamphetamine (ADDERALL XR) 30 MG 24 hr capsule Take 2 capsules (60 mg total) by mouth daily. (Patient not taking: Reported on 05/04/2019)   . [START ON 06/18/2019]  amphetamine-dextroamphetamine (ADDERALL XR) 30 MG 24 hr capsule Take 2 capsules (60 mg total) by mouth daily. (Patient not taking: Reported on 03/23/2019)   . norethindrone (MICRONOR) 0.35 MG tablet Take 1 tablet (0.35 mg total) by mouth daily. (Patient not taking: Reported on 03/10/2019)    No facility-administered encounter medications on file as of 05/04/2019.    Allergies: No Known Allergies  Surgical History: Past Surgical History:  Procedure Laterality Date  . TYMPANOSTOMY TUBE PLACEMENT     twice    Family History:  Family History  Problem Relation Age of Onset  . ADD / ADHD Brother   . ADD / ADHD Brother   . Diabetes Brother   . ADD / ADHD Brother   . Allergies Brother   . Diabetes Maternal Grandmother   . Hypertension Maternal Grandmother   . Cancer Maternal Grandfather   . Alcohol abuse Neg Hx   . Arthritis Neg Hx   . Asthma Neg Hx   . Birth defects Neg Hx   . COPD Neg Hx   . Depression Neg Hx   . Drug abuse Neg Hx   . Early death Neg Hx   . Hearing loss Neg Hx   . Heart disease Neg Hx   . Hyperlipidemia Neg Hx   . Kidney disease Neg Hx   . Learning disabilities Neg Hx   . Mental illness Neg Hx   . Mental retardation Neg Hx   . Miscarriages / Stillbirths Neg Hx   . Stroke Neg Hx   . Vision loss Neg Hx   . Varicose Veins Neg Hx      Social History: Lives with: Grandmother, 3 brother and 2 nieces.    Physical Exam:  Vitals:   05/04/19 0838  BP: (!) 142/80  Pulse: 94  Weight: 128 lb 3.2 oz (58.2 kg)    Body mass index: body mass index is 21.99 kg/m. Blood pressure percentiles are not available for patients who are 18 years or older.  Wt Readings from Last 3 Encounters:  05/04/19 128 lb 3.2 oz (58.2 kg) (52 %, Z= 0.04)*  04/07/19 121 lb 12.8 oz (55.2 kg) (39 %, Z= -0.27)*  03/23/19 126 lb 12.8 oz (57.5 kg) (49 %, Z= -0.01)*   * Growth percentiles are based on CDC (Girls, 2-20 Years) data.   Ht Readings from Last 3 Encounters:  04/07/19 5'  4.02" (1.626 m) (46 %, Z= -0.11)*  03/16/19 5\' 4"  (1.626 m) (46 %, Z= -0.11)*  03/10/19 5' 4.13" (1.629 m) (48 %, Z= -0.06)*   * Growth percentiles are based on CDC (Girls, 2-20 Years) data.     52 %ile (Z= 0.04) based on CDC (Girls, 2-20 Years) weight-for-age data using vitals from 05/04/2019. No height on file for this encounter. 54 %ile (Z= 0.11) based on CDC (Girls, 2-20 Years) BMI-for-age data using weight from 05/04/2019 and height from 04/07/2019.  General: Well developed, well nourished female in no acute distress.  Alert and oriented  Head: Normocephalic, atraumatic.  Eyes:  Pupils equal and round. EOMI.   Sclera white.  No eye drainage.   Ears/Nose/Mouth/Throat: Nares patent, no nasal drainage.  Normal dentition, mucous membranes moist. + tongue fasciculation  Neck: supple, no cervical lymphadenopathy, + thyromegaly (size improving) no nodules or tenderness.  Cardiovascular: regular rate, normal S1/S2, no murmurs Respiratory: No increased work of breathing.  Lungs clear to auscultation bilaterally.  No wheezes. Abdomen: soft, nontender, nondistended. Normal bowel sounds.  No appreciable masses  Extremities: warm, well perfused, cap refill < 2 sec.   Musculoskeletal: Normal muscle mass.  Normal strength Skin: warm, dry.  No rash or lesions. Neurologic: alert and oriented, normal speech, + mild tremor.    Laboratory Evaluation:     Assessment/Plan:  Sheronda Parran is a 19 y.o. female with hyperthyroidism/Graves disease on 20 mg of Methimazole in the morning and 15 at night, also taking 25 mg of Atenolol as needed for symptom control.   1. Hyperthyroidism 2. Thyromegaly 3. Tachycardia 4. Tremor 5. Tongue fasciculation  -Discussed pituitary/thyroid axis and explained causes of hyperthyroidism, including Graves disease and Hasimoto's thyroiditis.   - 20 mg of Methimazole morning and 15 mg at night  - 25 mg of atenolol daily if HR is >100  - No activity/exercise if HR is >100   - discussed s/s of hyperthyroidism.  - TSH, FT4, T4, t3 and CBC ordered.   Follow-up:   3 weeks.   Medical decision-making:  >30  spent today reviewing the medical chart, counseling the patient/family, and documenting today's visit.    Hermenia Bers,  FNP-C  [facility]  [address] [place]  [place], [address]  Tele: [phone]

## 2019-05-05 LAB — CBC WITH DIFFERENTIAL/PLATELET
Absolute Monocytes: 586 cells/uL (ref 200–950)
Basophils Absolute: 19 cells/uL (ref 0–200)
Basophils Relative: 0.4 %
Eosinophils Absolute: 0 cells/uL — ABNORMAL LOW (ref 15–500)
Eosinophils Relative: 0 %
HCT: 35.8 % (ref 35.0–45.0)
Hemoglobin: 10.7 g/dL — ABNORMAL LOW (ref 11.7–15.5)
Lymphs Abs: 1858 cells/uL (ref 850–3900)
MCH: 20.9 pg — ABNORMAL LOW (ref 27.0–33.0)
MCHC: 29.9 g/dL — ABNORMAL LOW (ref 32.0–36.0)
MCV: 70.1 fL — ABNORMAL LOW (ref 80.0–100.0)
MPV: 9.7 fL (ref 7.5–12.5)
Monocytes Relative: 12.2 %
Neutro Abs: 2338 cells/uL (ref 1500–7800)
Neutrophils Relative %: 48.7 %
Platelets: 233 10*3/uL (ref 140–400)
RBC: 5.11 10*6/uL — ABNORMAL HIGH (ref 3.80–5.10)
RDW: 17.2 % — ABNORMAL HIGH (ref 11.0–15.0)
Total Lymphocyte: 38.7 %
WBC: 4.8 10*3/uL (ref 3.8–10.8)

## 2019-05-05 LAB — T3: T3, Total: 254 ng/dL — ABNORMAL HIGH (ref 86–192)

## 2019-05-05 LAB — T4: T4, Total: 10.5 ug/dL (ref 5.3–11.7)

## 2019-05-05 LAB — TSH: TSH: 0.01 mIU/L — ABNORMAL LOW

## 2019-05-05 LAB — T4, FREE: Free T4: 1.6 ng/dL — ABNORMAL HIGH (ref 0.8–1.4)

## 2019-05-25 ENCOUNTER — Ambulatory Visit (INDEPENDENT_AMBULATORY_CARE_PROVIDER_SITE_OTHER): Payer: Self-pay | Admitting: Pediatrics

## 2019-05-25 ENCOUNTER — Ambulatory Visit (INDEPENDENT_AMBULATORY_CARE_PROVIDER_SITE_OTHER): Payer: Medicaid Other | Admitting: Family

## 2019-05-25 ENCOUNTER — Encounter: Payer: Self-pay | Admitting: Pediatrics

## 2019-05-25 ENCOUNTER — Encounter (INDEPENDENT_AMBULATORY_CARE_PROVIDER_SITE_OTHER): Payer: Self-pay | Admitting: Family

## 2019-05-25 ENCOUNTER — Other Ambulatory Visit: Payer: Self-pay

## 2019-05-25 VITALS — BP 136/80 | HR 152 | Wt 128.6 lb

## 2019-05-25 VITALS — BP 120/74 | Ht 63.78 in | Wt 126.5 lb

## 2019-05-25 DIAGNOSIS — R251 Tremor, unspecified: Secondary | ICD-10-CM | POA: Diagnosis not present

## 2019-05-25 DIAGNOSIS — E0590 Thyrotoxicosis, unspecified without thyrotoxic crisis or storm: Secondary | ICD-10-CM | POA: Diagnosis not present

## 2019-05-25 DIAGNOSIS — E0500 Thyrotoxicosis with diffuse goiter without thyrotoxic crisis or storm: Secondary | ICD-10-CM

## 2019-05-25 DIAGNOSIS — R000 Tachycardia, unspecified: Secondary | ICD-10-CM

## 2019-05-25 DIAGNOSIS — Z79899 Other long term (current) drug therapy: Secondary | ICD-10-CM

## 2019-05-25 DIAGNOSIS — L700 Acne vulgaris: Secondary | ICD-10-CM

## 2019-05-25 MED ORDER — CLINDAMYCIN PHOS-BENZOYL PEROX 1.2-5 % EX GEL: 1.0000 "application " | Freq: Two times a day (BID) | CUTANEOUS | 12 refills | Status: AC

## 2019-05-25 MED ORDER — AMPHETAMINE-DEXTROAMPHET ER 30 MG PO CP24: 60.0000 mg | ORAL_CAPSULE | Freq: Every day | ORAL | 0 refills | Status: DC

## 2019-05-25 MED ORDER — CETIRIZINE HCL 10 MG PO TABS
10.0000 mg | ORAL_TABLET | Freq: Every day | ORAL | 12 refills | Status: DC
Start: 2019-05-25 — End: 2019-05-25

## 2019-05-25 MED ORDER — CETIRIZINE HCL 10 MG PO TABS: 10.0000 mg | ORAL_TABLET | Freq: Every day | ORAL | 12 refills | Status: AC

## 2019-05-25 MED ORDER — FLUTICASONE PROPIONATE 50 MCG/ACT NA SUSP: 1.0000 | Freq: Every day | NASAL | 12 refills | Status: AC

## 2019-05-25 NOTE — Progress Notes (Signed)
Pediatric Endocrinology Consultation Follow Up Visit  [name] [name] 01-02-2001  Estelle June, NP  Chief Complaint: Hyperthyroid/Graves  History obtained from: [name] [name], and review of records from PCP  HPI: [name] [name]  is a 19 y.o. female being seen in consultation at the request of  [name], Pascal Lux, NP for evaluation of the above concerns.   1.  [name] [name] was seen by her adolescent medicine on 01/2018 where she was noted to have lmenorrhagia so labs were ordered. Her TSH resulted at 0.01 with a FT4 of 1.9. [name], NP contacted me at that time and I advised to repeat labs with thyroid antibodies. Her second set of labs showed elevated TSI that is positive for Graves disease, her TSH had also remained at 0.01 with a higher FT4 of 2.5 and elevated T3 of 395. She was started on 5 mg of Methimazole BID and 25 mg of Atenolol daily for symptom control.  [facility] ([facility] for further evaluation.    2. Since her last visit to clinic on 03/2019, she has been well.   She reports that her allergies have been bothering her a lot this year. She is going to see her PCP today to discuss with them. She is taking 20 mg of Methimazole twice per day, she thinks she has only missed one dose. Estimates she takes the Atenolol about 3 x per week.   She taking Iron supplement daily.   Thyroid symptoms: Heat or cold intolerance: Denies.   Weight changes: denies Energy level: Energy is good.  Sleep: Not sleeping well due to allergies and headache.  Skin changes: denies Constipation/Diarrhea: denies Difficulty swallowing: denies Neck swelling: Denies Periods regular: not as heavy  Tremor: Not as noticeable.  Palpitations: Denies except if she tries to exercise.    ROS: All systems reviewed with pertinent positives listed below; otherwise negative. Constitutional: Weight as above.  Feels better overall.  HEENT: No vision changes. No neck pain. No difficulty  swallowing. + nasal congestion Respiratory: No increased work of breathing currently Cardiac: intermittent tachycardia. No palpitations.  GI: No constipation or diarrhea, no stomach aches.  GU: No nocturia. No polyuria.  Musculoskeletal: No joint deformity Neuro: Normal affect. Denies tremors currently.  Endocrine: As above   Past Medical History:  Past Medical History:  Diagnosis Date  . ADHD (attention deficit hyperactivity disorder) 12/31/2010  . Hearing loss   . 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 Delivered at term Discharged home with mom  Meds: Outpatient Encounter Medications as of 05/25/2019  Medication Sig Note  . [START ON 06/18/2019] amphetamine-dextroamphetamine (ADDERALL XR) 30 MG 24 hr capsule Take 2 capsules (60 mg total) by mouth daily.   Marland Kitchen atenolol (TENORMIN) 25 MG tablet Take 1 tablet (25 mg total) by mouth daily. Take in the morning if your heart rate is greater than 100.   . ferrous fumarate (HEMOCYTE - 106 MG FE) 325 (106 Fe) MG TABS tablet Take 1 tablet (106 mg of iron total) by mouth daily.   . methimazole (TAPAZOLE) 10 MG tablet Take 1.5 tablets (15 mg total) by mouth 2 (two) times daily. 05/04/2019: 20 mg in the am, 15 mg in the evening  . acetaminophen (TYLENOL) 325 MG tablet Take 2 tablets (650 mg total) by mouth every 4 (four) hours as needed for mild pain. (Patient not taking: Reported on 05/25/2019)   . amphetamine-dextroamphetamine (ADDERALL XR) 30 MG  24 hr capsule Take 2 capsules (60 mg total) by mouth daily.   Marland Kitchen amphetamine-dextroamphetamine (ADDERALL XR) 30 MG 24 hr capsule Take 2 capsules (60 mg total) by mouth daily.   Marland Kitchen amphetamine-dextroamphetamine (ADDERALL XR) 30 MG 24 hr capsule Take 2 capsules (60 mg total) by mouth daily.   Marland Kitchen amphetamine-dextroamphetamine (ADDERALL XR) 30 MG 24 hr capsule Take 2 capsules (60 mg total) by mouth daily. (Patient not taking: Reported on  05/25/2019)   . norethindrone (MICRONOR) 0.35 MG tablet Take 1 tablet (0.35 mg total) by mouth daily. (Patient not taking: Reported on 03/10/2019)    No facility-administered encounter medications on file as of 05/25/2019.    Allergies: No Known Allergies  Surgical History: Past Surgical History:  Procedure Laterality Date  . TYMPANOSTOMY TUBE PLACEMENT     twice    Family History:  Family History  Problem Relation Age of Onset  . ADD / ADHD Brother   . ADD / ADHD Brother   . Diabetes Brother   . ADD / ADHD Brother   . Allergies Brother   . Diabetes Maternal Grandmother   . Hypertension Maternal Grandmother   . Cancer Maternal Grandfather   . Alcohol abuse Neg Hx   . Arthritis Neg Hx   . Asthma Neg Hx   . Birth defects Neg Hx   . COPD Neg Hx   . Depression Neg Hx   . Drug abuse Neg Hx   . Early death Neg Hx   . Hearing loss Neg Hx   . Heart disease Neg Hx   . Hyperlipidemia Neg Hx   . Kidney disease Neg Hx   . Learning disabilities Neg Hx   . Mental illness Neg Hx   . Mental retardation Neg Hx   . Miscarriages / Stillbirths Neg Hx   . Stroke Neg Hx   . Vision loss Neg Hx   . Varicose Veins Neg Hx      Social History: Lives with: Grandmother, 3 brother and 2 nieces.    Physical Exam:  Vitals:   05/25/19 0836  BP: 136/80  Pulse: (!) 152  Weight: 128 lb 9.6 oz (58.3 kg)    Body mass index: body mass index is 22.06 kg/m. Blood pressure percentiles are not available for patients who are 18 years or older.  Wt Readings from Last 3 Encounters:  05/25/19 128 lb 9.6 oz (58.3 kg) (52 %, Z= 0.05)*  05/04/19 128 lb 3.2 oz (58.2 kg) (52 %, Z= 0.04)*  04/07/19 121 lb 12.8 oz (55.2 kg) (39 %, Z= -0.27)*   * Growth percentiles are based on CDC (Girls, 2-20 Years) data.   Ht Readings from Last 3 Encounters:  04/07/19 5' 4.02" (1.626 m) (46 %, Z= -0.11)*  03/16/19 5\' 4"  (1.626 m) (46 %, Z= -0.11)*  03/10/19 5' 4.13" (1.629 m) (48 %, Z= -0.06)*   * Growth  percentiles are based on CDC (Girls, 2-20 Years) data.     52 %ile (Z= 0.05) based on CDC (Girls, 2-20 Years) weight-for-age data using vitals from 05/25/2019. No height on file for this encounter. 55 %ile (Z= 0.12) based on CDC (Girls, 2-20 Years) BMI-for-age data using weight from 05/25/2019 and height from 04/07/2019.  General: Well developed, well nourished female in no acute distress.  Head: Normocephalic, atraumatic.   Eyes:  Pupils equal and round. EOMI.   Sclera white.  No eye drainage.   Ears/Nose/Mouth/Throat: Nares patent, no nasal drainage.  Normal dentition, mucous membranes moist.  Neck: supple, no cervical lymphadenopathy, + thyromegaly, thyroid is enlarged bilaterally. No nodules or tenderness.  Cardiovascular: regular rate, normal S1/S2, no murmurs Respiratory: No increased work of breathing.  Lungs clear to auscultation bilaterally.  No wheezes. Abdomen: soft, nontender, nondistended. Normal bowel sounds.  No appreciable masses  Extremities: warm, well perfused, cap refill < 2 sec.   Musculoskeletal: Normal muscle mass.  Normal strength Skin: warm, dry.  No rash or lesions. Neurologic: alert and oriented, normal speech, + tremor    Laboratory Evaluation:     Assessment/Plan:  [name] [name] is a 19 y.o. female with hyperthyroidism/Graves disease. She is taking 20 mg of Methimazole BID and 25 mg of Atenolol daily as needed for symptoms.   1. Hyperthyroidism 2. Thyromegaly 3. Tachycardia 4. Tremor 5. Tongue fasciculation  -Discussed pituitary/thyroid axis and explained causes of hyperthyroidism, including Graves disease and Hasimoto's thyroiditis.   - Reviewed signs and symptoms of hyperthyroidism.  - 20 mg of Methimazole BID  - 25 mg of Atenolol daily if HR is >100  - No exercise/activity if HR is >100  - TSH, FT4, T4,  T3 and CBC ordered today.  .   Follow-up:   1 month.   Medical decision-making:  >30  spent today reviewing the medical chart, counseling  the patient/family, and documenting today's visit.     Hermenia Bers,  FNP-C  [facility]  [address] [place]  [place], [address]  Tele: [phone]

## 2019-05-25 NOTE — Patient Instructions (Signed)
20 mg of methimazole BID  Atenolol 25 mg if pulse is over 120  No exercise if HR is over 100   Follow up in 1 month.

## 2019-05-25 NOTE — Progress Notes (Signed)
ADHD meds refilled after normal weight and Blood pressure. Doing well on present dose. See again in 3 months  [name] [name] has requested a referral to dermatology for evaluation of facial acne. She has tried several over the counter products with no improvement. Will refer to dermatology.

## 2019-05-26 LAB — CBC WITH DIFFERENTIAL/PLATELET
Absolute Monocytes: 554 cells/uL (ref 200–950)
Basophils Absolute: 10 cells/uL (ref 0–200)
Basophils Relative: 0.3 %
Eosinophils Absolute: 10 cells/uL — ABNORMAL LOW (ref 15–500)
Eosinophils Relative: 0.3 %
HCT: 32.4 % — ABNORMAL LOW (ref 35.0–45.0)
Hemoglobin: 9.9 g/dL — ABNORMAL LOW (ref 11.7–15.5)
Lymphs Abs: 1086 cells/uL (ref 850–3900)
MCH: 21.1 pg — ABNORMAL LOW (ref 27.0–33.0)
MCHC: 30.6 g/dL — ABNORMAL LOW (ref 32.0–36.0)
MCV: 68.9 fL — ABNORMAL LOW (ref 80.0–100.0)
MPV: 10.8 fL (ref 7.5–12.5)
Monocytes Relative: 16.8 %
Neutro Abs: 1640 cells/uL (ref 1500–7800)
Neutrophils Relative %: 49.7 %
Platelets: 189 10*3/uL (ref 140–400)
RBC: 4.7 10*6/uL (ref 3.80–5.10)
RDW: 16.8 % — ABNORMAL HIGH (ref 11.0–15.0)
Total Lymphocyte: 32.9 %
WBC: 3.3 10*3/uL — ABNORMAL LOW (ref 3.8–10.8)

## 2019-05-26 LAB — TSH: TSH: 0.01 mIU/L — ABNORMAL LOW

## 2019-05-26 LAB — T4, FREE: Free T4: 2.2 ng/dL — ABNORMAL HIGH (ref 0.8–1.4)

## 2019-05-26 LAB — T3: T3, Total: 245 ng/dL — ABNORMAL HIGH (ref 86–192)

## 2019-05-26 LAB — T4: T4, Total: 12.3 ug/dL — ABNORMAL HIGH (ref 5.3–11.7)

## 2019-05-27 ENCOUNTER — Other Ambulatory Visit (INDEPENDENT_AMBULATORY_CARE_PROVIDER_SITE_OTHER): Payer: Self-pay | Admitting: Family

## 2019-05-27 MED ORDER — METHIMAZOLE 10 MG PO TABS: 25.0000 mg | ORAL_TABLET | Freq: Two times a day (BID) | ORAL | 1 refills | Status: DC

## 2019-06-02 ENCOUNTER — Telehealth: Payer: Self-pay | Admitting: Pediatrics

## 2019-06-02 NOTE — Telephone Encounter (Signed)
Patient states her "ear is killing her" and would like you to call eardrops to [id number] in [place] . She thinks it's ear wax

## 2019-06-03 MED ORDER — DERMOTIC 0.01 % OT OIL: 1.0000 [drp] | TOPICAL_OIL | Freq: Every day | OTIC | 0 refills | Status: AC

## 2019-06-03 NOTE — Telephone Encounter (Signed)
Dermotic oil drops sent to pharmacy for ear pain relief.

## 2019-06-24 ENCOUNTER — Encounter (INDEPENDENT_AMBULATORY_CARE_PROVIDER_SITE_OTHER): Payer: Self-pay | Admitting: Family

## 2019-06-24 ENCOUNTER — Ambulatory Visit (INDEPENDENT_AMBULATORY_CARE_PROVIDER_SITE_OTHER): Payer: Medicaid Other | Admitting: Family

## 2019-06-24 ENCOUNTER — Other Ambulatory Visit: Payer: Self-pay

## 2019-06-24 VITALS — BP 118/74 | HR 104 | Ht 64.37 in | Wt 129.6 lb

## 2019-06-24 DIAGNOSIS — E0500 Thyrotoxicosis with diffuse goiter without thyrotoxic crisis or storm: Secondary | ICD-10-CM | POA: Diagnosis not present

## 2019-06-24 DIAGNOSIS — R000 Tachycardia, unspecified: Secondary | ICD-10-CM

## 2019-06-24 DIAGNOSIS — E0590 Thyrotoxicosis, unspecified without thyrotoxic crisis or storm: Secondary | ICD-10-CM | POA: Diagnosis not present

## 2019-06-24 DIAGNOSIS — R251 Tremor, unspecified: Secondary | ICD-10-CM

## 2019-06-24 NOTE — Progress Notes (Signed)
Pediatric Endocrinology Consultation Follow Up Visit  [name] [name], [name] [name] 08/19/2000  [name] Anna, NP  Chief Complaint: Hyperthyroid/Graves  History obtained from: [name] [name], and review of records from PCP  HPI: [name] [name]  is a 19 y.o. female being seen in consultation at the request of  [name], [name], NP for evaluation of the above concerns.   1.  [name] [name] was seen by her adolescent medicine on 01/2018 where she was noted to have lmenorrhagia so labs were ordered. Her TSH resulted at 0.01 with a FT4 of 1.9. [name] Resides, NP contacted me at that time and I advised to repeat labs with thyroid antibodies. Her second set of labs showed elevated TSI that is positive for Graves disease, her TSH had also remained at 0.01 with a higher FT4 of 2.5 and elevated T3 of 395. She was started on 5 mg of Methimazole BID and 25 mg of Atenolol daily for symptom control.  [facility] is [facility] ([facility] for further evaluation.    2. Since her last visit to clinic on 04/2019, she has been well.   She has been busy working at her [employer]. She is taking 25 mg of methimazole BID, she estimates she misses about 1 dose per week. She is rarely taking Atenolol, estimates once per week. She does not feel as symptomatic.    She taking Iron supplement daily.   Thyroid symptoms: Heat or cold intolerance: Denies.   Weight changes: denies Energy level: Energy is good.  Sleep: Sleep is "normal" Skin changes: denies Constipation/Diarrhea: denies Difficulty swallowing: denies Neck swelling: Denies Periods regular: not as heavy  Tremor: Not as noticeable.  Palpitations: Denies except if she tries to exercise.    ROS: All systems reviewed with pertinent positives listed below; otherwise negative. Constitutional: Weight as above.  Feels better overall.  HEENT: No vision changes. No neck pain. No difficulty swallowing. Respiratory: No increased work of breathing currently Cardiac:  intermittent tachycardia. No palpitations.  GI: No constipation or diarrhea, no stomach aches.  GU: No nocturia. No polyuria.  Musculoskeletal: No joint deformity Neuro: Normal affect. Denies tremors currently.  Endocrine: As above   Past Medical History:  Past Medical History:  Diagnosis Date  . ADHD (attention deficit hyperactivity disorder) 12/31/2010  . Hearing loss   . 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 Delivered at term Discharged home with mom  Meds: Outpatient Encounter Medications as of 06/24/2019  Medication Sig  . acetaminophen (TYLENOL) 325 MG tablet Take 2 tablets (650 mg total) by mouth every 4 (four) hours as needed for mild pain.  Derrill Memo ON 07/19/2019] amphetamine-dextroamphetamine (ADDERALL XR) 30 MG 24 hr capsule Take 2 capsules (60 mg total) by mouth daily.  . cetirizine (ZYRTEC) 10 MG tablet Take 1 tablet (10 mg total) by mouth daily.  . Clindamycin-Benzoyl Per, Refr, gel Apply 1 application topically 2 (two) times daily.  . ferrous fumarate (HEMOCYTE - 106 MG FE) 325 (106 Fe) MG TABS tablet Take 1 tablet (106 mg of iron total) by mouth daily.  . fluticasone (FLONASE) 50 MCG/ACT nasal spray Place 1 spray into both nostrils daily.  . methimazole (TAPAZOLE) 10 MG tablet Take 2.5 tablets (25 mg total) by mouth 2 (two) times daily.  Marland Kitchen amphetamine-dextroamphetamine (ADDERALL XR) 30 MG 24 hr capsule Take 2 capsules (60 mg total) by mouth daily. (Patient not taking: Reported on 05/25/2019)  . [START ON 08/18/2019] amphetamine-dextroamphetamine (ADDERALL XR)  30 MG 24 hr capsule Take 2 capsules (60 mg total) by mouth daily. (Patient not taking: Reported on 06/24/2019)  . [START ON 09/18/2019] amphetamine-dextroamphetamine (ADDERALL XR) 30 MG 24 hr capsule Take 2 capsules (60 mg total) by mouth daily. (Patient not taking: Reported on 06/24/2019)  . atenolol (TENORMIN) 25 MG tablet Take 1 tablet (25 mg  total) by mouth daily. Take in the morning if your heart rate is greater than 100.  . norethindrone (MICRONOR) 0.35 MG tablet Take 1 tablet (0.35 mg total) by mouth daily. (Patient not taking: Reported on 03/10/2019)   No facility-administered encounter medications on file as of 06/24/2019.    Allergies: No Known Allergies  Surgical History: Past Surgical History:  Procedure Laterality Date  . TYMPANOSTOMY TUBE PLACEMENT     twice    Family History:  Family History  Problem Relation Age of Onset  . ADD / ADHD Brother   . ADD / ADHD Brother   . Diabetes Brother   . ADD / ADHD Brother   . Allergies Brother   . Diabetes Maternal Grandmother   . Hypertension Maternal Grandmother   . Cancer Maternal Grandfather   . Alcohol abuse Neg Hx   . Arthritis Neg Hx   . Asthma Neg Hx   . Birth defects Neg Hx   . COPD Neg Hx   . Depression Neg Hx   . Drug abuse Neg Hx   . Early death Neg Hx   . Hearing loss Neg Hx   . Heart disease Neg Hx   . Hyperlipidemia Neg Hx   . Kidney disease Neg Hx   . Learning disabilities Neg Hx   . Mental illness Neg Hx   . Mental retardation Neg Hx   . Miscarriages / Stillbirths Neg Hx   . Stroke Neg Hx   . Vision loss Neg Hx   . Varicose Veins Neg Hx      Social History: Lives with: Grandmother, 3 brother and 2 nieces.    Physical Exam:  Vitals:   06/24/19 0938  BP: 118/74  Pulse: (!) 104  Weight: 129 lb 9.6 oz (58.8 kg)  Height: 5' 4.37" (1.635 m)    Body mass index: body mass index is 21.99 kg/m. Blood pressure percentiles are not available for patients who are 18 years or older.  Wt Readings from Last 3 Encounters:  06/24/19 129 lb 9.6 oz (58.8 kg) (54 %, Z= 0.09)*  05/25/19 126 lb 8 oz (57.4 kg) (48 %, Z= -0.05)*  05/25/19 128 lb 9.6 oz (58.3 kg) (52 %, Z= 0.05)*   * Growth percentiles are based on CDC (Girls, 2-20 Years) data.   Ht Readings from Last 3 Encounters:  06/24/19 5' 4.37" (1.635 m) (51 %, Z= 0.03)*  05/25/19 5'  3.78" (1.62 m) (42 %, Z= -0.20)*  04/07/19 5' 4.02" (1.626 m) (46 %, Z= -0.11)*   * Growth percentiles are based on CDC (Girls, 2-20 Years) data.     54 %ile (Z= 0.09) based on CDC (Girls, 2-20 Years) weight-for-age data using vitals from 06/24/2019. 51 %ile (Z= 0.03) based on CDC (Girls, 2-20 Years) Stature-for-age data based on Stature recorded on 06/24/2019. 54 %ile (Z= 0.10) based on CDC (Girls, 2-20 Years) BMI-for-age based on BMI available as of 06/24/2019.  General: Well developed, well nourished female in no acute distress.  Head: Normocephalic, atraumatic.   Eyes:  Pupils equal and round. EOMI.   Sclera white.  No eye drainage.   Ears/Nose/Mouth/Throat: Nares  patent, no nasal drainage.  Normal dentition, mucous membranes moist.   Neck: supple, no cervical lymphadenopathy, Cardiovascular: regular rate, normal S1/S2, no murmurs Respiratory: No increased work of breathing.  Lungs clear to auscultation bilaterally.  No wheezes. Abdomen: soft, nontender, nondistended. Normal bowel sounds.  No appreciable masses  Extremities: warm, well perfused, cap refill < 2 sec.   Musculoskeletal: Normal muscle mass.  Normal strength Skin: warm, dry.  No rash or lesions. Neurologic: alert and oriented, normal speech, no tremor    Laboratory Evaluation:     Assessment/Plan:  [name] [name] is a 19 y.o. female with hyperthyroidism/Graves disease. Taking 25 mg of Methimazole BID, she reports clinical improvements.   1. Hyperthyroidism 2. Thyromegaly 3. Tachycardia 4. Tremor -Discussed pituitary/thyroid axis and explained causes of hyperthyroidism, including Graves disease and Hasimoto's thyroiditis.   - 25 mg of Methimazole BID.  - 25 mg of atenolol for symptom management if HR is >100  - Reviewed s/s of hyperthyroidism and thyroid storm.  - No exercise if HR is >100  - TSH, FT4, T4 and T3 ordered.   .  .   Follow-up:   1 month.   Medical decision-making:  >30  spent today reviewing  the medical chart, counseling the patient/family, and documenting today's visit.     Gretchen Short,  FNP-C  [facility]  [address]  [place], [address]  Tele: [phone]

## 2019-06-24 NOTE — Patient Instructions (Signed)
Continue 25 mg of methimazole per day  - 1 month follow up.

## 2019-06-25 DIAGNOSIS — E0590 Thyrotoxicosis, unspecified without thyrotoxic crisis or storm: Secondary | ICD-10-CM | POA: Diagnosis not present

## 2019-06-26 LAB — TSH: TSH: <0.01 mIU/L — ABNORMAL LOW

## 2019-06-26 LAB — T4, FREE: Free T4: 2.3 ng/dL — ABNORMAL HIGH (ref 0.8–1.4)

## 2019-06-26 LAB — T4: T4, Total: 11.7 ug/dL (ref 5.3–11.7)

## 2019-06-26 LAB — T3: T3, Total: 307 ng/dL — ABNORMAL HIGH (ref 86–192)

## 2019-06-29 ENCOUNTER — Encounter (INDEPENDENT_AMBULATORY_CARE_PROVIDER_SITE_OTHER): Payer: Self-pay

## 2019-06-29 ENCOUNTER — Other Ambulatory Visit (INDEPENDENT_AMBULATORY_CARE_PROVIDER_SITE_OTHER): Payer: Self-pay | Admitting: Family

## 2019-06-29 MED ORDER — METHIMAZOLE 10 MG PO TABS: 25.0000 mg | ORAL_TABLET | Freq: Two times a day (BID) | ORAL | 1 refills | Status: DC

## 2019-07-02 ENCOUNTER — Encounter (INDEPENDENT_AMBULATORY_CARE_PROVIDER_SITE_OTHER): Payer: Self-pay

## 2019-07-05 ENCOUNTER — Other Ambulatory Visit (INDEPENDENT_AMBULATORY_CARE_PROVIDER_SITE_OTHER): Payer: Self-pay | Admitting: Family

## 2019-07-05 ENCOUNTER — Encounter (INDEPENDENT_AMBULATORY_CARE_PROVIDER_SITE_OTHER): Payer: Self-pay

## 2019-07-05 DIAGNOSIS — E0590 Thyrotoxicosis, unspecified without thyrotoxic crisis or storm: Secondary | ICD-10-CM

## 2019-07-05 DIAGNOSIS — R040 Epistaxis: Secondary | ICD-10-CM

## 2019-07-13 ENCOUNTER — Encounter (INDEPENDENT_AMBULATORY_CARE_PROVIDER_SITE_OTHER): Payer: Self-pay

## 2019-08-03 ENCOUNTER — Encounter: Payer: Self-pay | Admitting: Pediatrics

## 2019-08-03 ENCOUNTER — Other Ambulatory Visit: Payer: Self-pay

## 2019-08-03 ENCOUNTER — Ambulatory Visit (INDEPENDENT_AMBULATORY_CARE_PROVIDER_SITE_OTHER): Payer: Medicaid Other | Admitting: Pediatrics

## 2019-08-03 VITALS — Wt 132.9 lb

## 2019-08-03 DIAGNOSIS — G8929 Other chronic pain: Secondary | ICD-10-CM

## 2019-08-03 DIAGNOSIS — M25562 Pain in left knee: Secondary | ICD-10-CM

## 2019-08-03 DIAGNOSIS — H6691 Otitis media, unspecified, right ear: Secondary | ICD-10-CM | POA: Diagnosis not present

## 2019-08-03 MED ORDER — AMOXICILLIN-POT CLAVULANATE 500-125 MG PO TABS
1.0000 | ORAL_TABLET | Freq: Two times a day (BID) | ORAL | 0 refills | Status: AC
Start: 2019-08-03 — End: 2019-08-13

## 2019-08-03 NOTE — Addendum Note (Signed)
Addended by: Estevan Ryder on: 08/03/2019 10:37 AM   Modules accepted: Orders

## 2019-08-03 NOTE — Progress Notes (Signed)
Subjective:     [name] [name] is a 19 y.o. female who presents with ear pain and possible ear infection. Symptoms include: right ear pain and plugged sensation in the right ear. Onset of symptoms was several days ago, and have been unchanged since that time. Associated symptoms include: none.  Patient denies: achiness, chills, congestion, coryza, fever , headache, low grade fever, non productive cough, post nasal drip, productive cough, sinus pressure, sneezing and sore throat. She is drinking plenty of fluids.  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 She reports that there are some days where it feels like her knee is going to bend backwards and then she will have pain in that knee that lasts 3-4 hours. She has no known injuries. She is able to walk on the knee. She bought a "cheap knee brace" and wears it at work when her knee hurts.   The following portions of the patient's history were reviewed and updated as appropriate: allergies, current medications, past family history, past medical history, past social history, past surgical history and problem list.  Review of Systems Pertinent items are noted in HPI.   Objective:    Wt 132 lb 14.4 oz (60.3 kg)    BMI 22.55 kg/m  General:  alert, cooperative, appears stated age and no distress  Right Ear: TM erythematous, dull, bulging  Left Ear: normal landmarks and mobility  Mouth:  lips, mucosa, and tongue normal; teeth and gums normal  Neck: no adenopathy, no carotid bruit, no JVD, supple, symmetrical, trachea midline and thyroid not enlarged, symmetric, no tenderness/mass/nodules  Right Knee: Normal ROM, no edema, no tenderness with palpation  Left knee: Normal ROM, no edema, mild tenderness with palpation along medial aspect of knee     Assessment:    Right acute otitis media  Left knee pain  Plan:    Treatment: Augmentin. OTC analgesia as needed. Fluids, rest, avoid carbonated/alcoholic and caffeinated  beverages.  Follow up in 3 days if not improving.   Referral to orthopedics for evaluation on knee pain Discussed RICE and continued use of knee brace

## 2019-08-03 NOTE — Patient Instructions (Addendum)
Augmentin- take 1 tablet 2 times a day for 10 days Take a daily probiotic or eat yogurt while on antibiotic Referral to orthopedics for evaluation of left knee pain Ibuprofen every 6 hours as needed for pain Follow up as needed

## 2019-08-17 ENCOUNTER — Ambulatory Visit: Payer: Self-pay | Admitting: Family Medicine

## 2019-08-20 ENCOUNTER — Ambulatory Visit (INDEPENDENT_AMBULATORY_CARE_PROVIDER_SITE_OTHER): Payer: Medicaid Other | Admitting: Family

## 2019-08-20 NOTE — Progress Notes (Deleted)
Pediatric Endocrinology Consultation Follow Up Visit  Puckett, Lajoy March 16, 2000  Estelle June, NP  Chief Complaint: Hyperthyroid/Graves  History obtained from: Cristen, and review of records from PCP  HPI: Ai  is a 19 y.o. female being seen in consultation at the request of  [name], Pascal Lux, NP for evaluation of the above concerns.   1.  Eveny was seen by her adolescent medicine on 01/2018 where she was noted to have lmenorrhagia so labs were ordered. Her TSH resulted at 0.01 with a FT4 of 1.9. [name], NP contacted me at that time and I advised to repeat labs with thyroid antibodies. Her second set of labs showed elevated TSI that is positive for Graves disease, her TSH had also remained at 0.01 with a higher FT4 of 2.5 and elevated T3 of 395. She was started on 5 mg of Methimazole BID and 25 mg of Atenolol daily for symptom control.  she is referred to [facility] ([facility]) for further evaluation.    2. Since her last visit to clinic on 05/2019, she has been well.   She has been busy working at her [employer]. She is taking 25 mg of methimazole BID, she estimates she misses about 1 dose per week. She is rarely taking Atenolol, estimates once per week. She does not feel as symptomatic.    She taking Iron supplement daily.   Thyroid symptoms: Heat or cold intolerance: Denies.   Weight changes: denies Energy level: Energy is good.  Sleep: Sleep is "normal" Skin changes: denies Constipation/Diarrhea: denies Difficulty swallowing: denies Neck swelling: Denies Periods regular: not as heavy  Tremor: Not as noticeable.  Palpitations: Denies except if she tries to exercise.    ROS: All systems reviewed with pertinent positives listed below; otherwise negative. Constitutional: Weight as above.  Feels better overall.  HEENT: No vision changes. No neck pain. No difficulty swallowing. Respiratory: No increased work of breathing currently Cardiac:  intermittent tachycardia. No palpitations.  GI: No constipation or diarrhea, no stomach aches.  GU: No nocturia. No polyuria.  Musculoskeletal: No joint deformity Neuro: Normal affect. Denies tremors currently.  Endocrine: As above   Past Medical History:  Past Medical History:  Diagnosis Date  . ADHD (attention deficit hyperactivity disorder) 12/31/2010  . Hearing loss   . 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 Delivered at term Discharged home with mom  Meds: Outpatient Encounter Medications as of 08/20/2019  Medication Sig  . acetaminophen (TYLENOL) 325 MG tablet Take 2 tablets (650 mg total) by mouth every 4 (four) hours as needed for mild pain.  Marland Kitchen amphetamine-dextroamphetamine (ADDERALL XR) 30 MG 24 hr capsule Take 2 capsules (60 mg total) by mouth daily. (Patient not taking: Reported on 05/25/2019)  . amphetamine-dextroamphetamine (ADDERALL XR) 30 MG 24 hr capsule Take 2 capsules (60 mg total) by mouth daily.  Marland Kitchen amphetamine-dextroamphetamine (ADDERALL XR) 30 MG 24 hr capsule Take 2 capsules (60 mg total) by mouth daily. (Patient not taking: Reported on 06/24/2019)  . [START ON 09/18/2019] amphetamine-dextroamphetamine (ADDERALL XR) 30 MG 24 hr capsule Take 2 capsules (60 mg total) by mouth daily. (Patient not taking: Reported on 06/24/2019)  . atenolol (TENORMIN) 25 MG tablet Take 1 tablet (25 mg total) by mouth daily. Take in the morning if your heart rate is greater than 100.  . cetirizine (ZYRTEC) 10 MG tablet Take 1 tablet (10 mg total) by mouth daily.  . ferrous fumarate (  HEMOCYTE - 106 MG FE) 325 (106 Fe) MG TABS tablet Take 1 tablet (106 mg of iron total) by mouth daily.  . fluticasone (FLONASE) 50 MCG/ACT nasal spray Place 1 spray into both nostrils daily.  . methimazole (TAPAZOLE) 10 MG tablet Take 2.5 tablets (25 mg total) by mouth 2 (two) times daily.  . norethindrone (MICRONOR) 0.35 MG tablet Take  1 tablet (0.35 mg total) by mouth daily. (Patient not taking: Reported on 03/10/2019)   No facility-administered encounter medications on file as of 08/20/2019.    Allergies: No Known Allergies  Surgical History: Past Surgical History:  Procedure Laterality Date  . TYMPANOSTOMY TUBE PLACEMENT     twice    Family History:  Family History  Problem Relation Age of Onset  . ADD / ADHD Brother   . ADD / ADHD Brother   . Diabetes Brother   . ADD / ADHD Brother   . Allergies Brother   . Diabetes Maternal Grandmother   . Hypertension Maternal Grandmother   . Cancer Maternal Grandfather   . Alcohol abuse Neg Hx   . Arthritis Neg Hx   . Asthma Neg Hx   . Birth defects Neg Hx   . COPD Neg Hx   . Depression Neg Hx   . Drug abuse Neg Hx   . Early death Neg Hx   . Hearing loss Neg Hx   . Heart disease Neg Hx   . Hyperlipidemia Neg Hx   . Kidney disease Neg Hx   . Learning disabilities Neg Hx   . Mental illness Neg Hx   . Mental retardation Neg Hx   . Miscarriages / Stillbirths Neg Hx   . Stroke Neg Hx   . Vision loss Neg Hx   . Varicose Veins Neg Hx      Social History: Lives with: Grandmother, 3 brother and 2 nieces.    Physical Exam:  There were no vitals filed for this visit.  Body mass index: body mass index is unknown because there is no height or weight on file. Blood pressure percentiles are not available for patients who are 18 years or older.  Wt Readings from Last 3 Encounters:  08/03/19 132 lb 14.4 oz (60.3 kg) (59 %, Z= 0.23)*  06/24/19 129 lb 9.6 oz (58.8 kg) (54 %, Z= 0.09)*  05/25/19 126 lb 8 oz (57.4 kg) (48 %, Z= -0.05)*   * Growth percentiles are based on CDC (Girls, 2-20 Years) data.   Ht Readings from Last 3 Encounters:  06/24/19 5' 4.37" (1.635 m) (51 %, Z= 0.03)*  05/25/19 5' 3.78" (1.62 m) (42 %, Z= -0.20)*  04/07/19 5' 4.02" (1.626 m) (46 %, Z= -0.11)*   * Growth percentiles are based on CDC (Girls, 2-20 Years) data.     No weight on  file for this encounter. No height on file for this encounter. No height and weight on file for this encounter.  General: Well developed, well nourished female in no acute distress.  Head: Normocephalic, atraumatic.   Eyes:  Pupils equal and round. EOMI.   Sclera white.  No eye drainage.   Ears/Nose/Mouth/Throat: Nares patent, no nasal drainage.  Normal dentition, mucous membranes moist.   Neck: supple, no cervical lymphadenopathy, no thyromegaly Cardiovascular: regular rate, normal S1/S2, no murmurs Respiratory: No increased work of breathing.  Lungs clear to auscultation bilaterally.  No wheezes. Abdomen: soft, nontender, nondistended. Normal bowel sounds.  No appreciable masses  Extremities: warm, well perfused, cap refill < 2  sec.   Musculoskeletal: Normal muscle mass.  Normal strength Skin: warm, dry.  No rash or lesions. Neurologic: alert and oriented, normal speech, no tremor    Laboratory Evaluation:     Assessment/Plan:  Rhyli Depaula is a 19 y.o. female with hyperthyroidism/Graves disease. Taking 25 mg of Methimazole BID, she reports clinical improvements.   1. Hyperthyroidism 2. Thyromegaly 3. Tachycardia 4. Tremor -Discussed pituitary/thyroid axis and explained causes of hyperthyroidism, including Graves disease and Hasimoto's thyroiditis.   - 25 mg of atenolol as needed for symptom management  Or if HR is >100 - Do not exercise if HR is >100  - Reviewed s/s of hyperthyroidism and thyroid storm  - TSH, FT4, T4, T3 and CBC ordered.    Follow-up:   1 month.   Medical decision-making:  >30  spent today reviewing the medical chart, counseling the patient/family, and documenting today's visit.     Gretchen Short,  FNP-C  Pediatric Specialist  [address]  [place], [address]  Tele: [phone]

## 2019-08-24 ENCOUNTER — Encounter (INDEPENDENT_AMBULATORY_CARE_PROVIDER_SITE_OTHER): Payer: Self-pay

## 2019-08-26 ENCOUNTER — Encounter (INDEPENDENT_AMBULATORY_CARE_PROVIDER_SITE_OTHER): Payer: Self-pay | Admitting: Family

## 2019-08-26 ENCOUNTER — Other Ambulatory Visit: Payer: Self-pay

## 2019-08-26 ENCOUNTER — Ambulatory Visit (INDEPENDENT_AMBULATORY_CARE_PROVIDER_SITE_OTHER): Payer: Medicaid Other | Admitting: Family

## 2019-08-26 VITALS — BP 128/80 | HR 110 | Wt 133.4 lb

## 2019-08-26 DIAGNOSIS — R251 Tremor, unspecified: Secondary | ICD-10-CM

## 2019-08-26 DIAGNOSIS — E0500 Thyrotoxicosis with diffuse goiter without thyrotoxic crisis or storm: Secondary | ICD-10-CM | POA: Diagnosis not present

## 2019-08-26 DIAGNOSIS — E0590 Thyrotoxicosis, unspecified without thyrotoxic crisis or storm: Secondary | ICD-10-CM | POA: Diagnosis not present

## 2019-08-26 DIAGNOSIS — R000 Tachycardia, unspecified: Secondary | ICD-10-CM | POA: Diagnosis not present

## 2019-08-26 NOTE — Patient Instructions (Signed)
25 mg of methimazole twice per day  - Use atenolol if HR is >100 and you are symptomatic  - No exercise if HR is >120   - Follow up in 1 month.

## 2019-08-26 NOTE — Progress Notes (Signed)
Pediatric Endocrinology Consultation Follow Up Visit  Stuard, Cesilia January 13, 2001  [name] June, NP  Chief Complaint: Hyperthyroid/Graves  History obtained from: [place], and review of records from PCP  HPI: [name] [name]  is a 19 y.o. female being seen in consultation at the request of  [name], Pascal Lux, NP for evaluation of the above concerns.   1.  [name] was seen by her adolescent medicine on 01/2018 where she was noted to have lmenorrhagia so labs were ordered. Her TSH resulted at 0.01 with a FT4 of 1.9. [name], NP contacted me at that time and I advised to repeat labs with thyroid antibodies. Her second set of labs showed elevated TSI that is positive for Graves disease, her TSH had also remained at 0.01 with a higher FT4 of 2.5 and elevated T3 of 395. She was started on 5 mg of Methimazole BID and 25 mg of Atenolol daily for symptom control.  she is referred to [facility] ([facility]) for further evaluation.    2. Since her last visit to clinic on 05/2019, she has been well.   She is working most days of the week, will be starting college at [employer] (online). She is taking 25 mg of Methimazole BID, reports missing about 4-5 doses since her last visit. Rarely using the Atenolol    Thyroid symptoms: Heat or cold intolerance: Denies.   Weight changes: denies Energy level: Energy is good.  Sleep: Sleep is "normal" Skin changes: denies Constipation/Diarrhea: denies Difficulty swallowing: denies Neck swelling: Denies Periods regular: Lighter and more regular.  Tremor: "not much"  Palpitations: Denies    ROS: All systems reviewed with pertinent positives listed below; otherwise negative. Constitutional: Weight as above.  Sleeping better.  HEENT: No vision changes. No neck pain. No difficulty swallowing. Respiratory: No increased work of breathing currently Cardiac: intermittent tachycardia. No palpitations.  GI: No constipation or diarrhea, no  stomach aches.  GU: No nocturia. No polyuria.  Musculoskeletal: No joint deformity Neuro: Normal affect. Denies tremors currently.  Endocrine: As above   Past Medical History:  Past Medical History:  Diagnosis Date  . ADHD (attention deficit hyperactivity disorder) 12/31/2010  . Hearing loss   . 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 Delivered at term Discharged home with mom  Meds: Outpatient Encounter Medications as of 08/26/2019  Medication Sig  . amphetamine-dextroamphetamine (ADDERALL XR) 30 MG 24 hr capsule Take 2 capsules (60 mg total) by mouth daily.  . ferrous fumarate (HEMOCYTE - 106 MG FE) 325 (106 Fe) MG TABS tablet Take 1 tablet (106 mg of iron total) by mouth daily.  . methimazole (TAPAZOLE) 10 MG tablet Take 2.5 tablets (25 mg total) by mouth 2 (two) times daily.  Marland Kitchen acetaminophen (TYLENOL) 325 MG tablet Take 2 tablets (650 mg total) by mouth every 4 (four) hours as needed for mild pain. (Patient not taking: Reported on 08/26/2019)  . amphetamine-dextroamphetamine (ADDERALL XR) 30 MG 24 hr capsule Take 2 capsules (60 mg total) by mouth daily. (Patient not taking: Reported on 05/25/2019)  . amphetamine-dextroamphetamine (ADDERALL XR) 30 MG 24 hr capsule Take 2 capsules (60 mg total) by mouth daily.  Marland Kitchen atenolol (TENORMIN) 25 MG tablet Take 1 tablet (25 mg total) by mouth daily. Take in the morning if your heart rate is greater than 100.  . cetirizine (ZYRTEC) 10 MG tablet Take 1 tablet (10 mg total) by mouth daily. (Patient not taking: Reported  on 08/26/2019)  . fluticasone (FLONASE) 50 MCG/ACT nasal spray Place 1 spray into both nostrils daily. (Patient not taking: Reported on 08/26/2019)  . norethindrone (MICRONOR) 0.35 MG tablet Take 1 tablet (0.35 mg total) by mouth daily. (Patient not taking: Reported on 03/10/2019)  . [DISCONTINUED] amphetamine-dextroamphetamine (ADDERALL XR) 30 MG 24 hr capsule Take  2 capsules (60 mg total) by mouth daily. (Patient not taking: Reported on 06/24/2019)   No facility-administered encounter medications on file as of 08/26/2019.    Allergies: No Known Allergies  Surgical History: Past Surgical History:  Procedure Laterality Date  . TYMPANOSTOMY TUBE PLACEMENT     twice    Family History:  Family History  Problem Relation Age of Onset  . ADD / ADHD Brother   . ADD / ADHD Brother   . Diabetes Brother   . ADD / ADHD Brother   . Allergies Brother   . Diabetes Maternal Grandmother   . Hypertension Maternal Grandmother   . Cancer Maternal Grandfather   . Alcohol abuse Neg Hx   . Arthritis Neg Hx   . Asthma Neg Hx   . Birth defects Neg Hx   . COPD Neg Hx   . Depression Neg Hx   . Drug abuse Neg Hx   . Early death Neg Hx   . Hearing loss Neg Hx   . Heart disease Neg Hx   . Hyperlipidemia Neg Hx   . Kidney disease Neg Hx   . Learning disabilities Neg Hx   . Mental illness Neg Hx   . Mental retardation Neg Hx   . Miscarriages / Stillbirths Neg Hx   . Stroke Neg Hx   . Vision loss Neg Hx   . Varicose Veins Neg Hx      Social History: Lives with: Grandmother, 3 brother and 2 nieces.    Physical Exam:  Vitals:   08/26/19 0834  BP: 128/80  Pulse: (!) 110  Weight: 133 lb 6.4 oz (60.5 kg)    Body mass index: body mass index is 22.64 kg/m. Blood pressure percentiles are not available for patients who are 18 years or older.  Wt Readings from Last 3 Encounters:  08/26/19 133 lb 6.4 oz (60.5 kg) (60 %, Z= 0.24)*  08/03/19 132 lb 14.4 oz (60.3 kg) (59 %, Z= 0.23)*  06/24/19 129 lb 9.6 oz (58.8 kg) (54 %, Z= 0.09)*   * Growth percentiles are based on CDC (Girls, 2-20 Years) data.   Ht Readings from Last 3 Encounters:  06/24/19 5' 4.37" (1.635 m) (51 %, Z= 0.03)*  05/25/19 5' 3.78" (1.62 m) (42 %, Z= -0.20)*  04/07/19 5' 4.02" (1.626 m) (46 %, Z= -0.11)*   * Growth percentiles are based on CDC (Girls, 2-20 Years) data.     60  %ile (Z= 0.24) based on CDC (Girls, 2-20 Years) weight-for-age data using vitals from 08/26/2019. No height on file for this encounter. 61 %ile (Z= 0.27) based on CDC (Girls, 2-20 Years) BMI-for-age data using weight from 08/26/2019 and height from 06/24/2019.  General: Well developed, well nourished female in no acute distress.  Head: Normocephalic, atraumatic.   Eyes:  Pupils equal and round. EOMI.   Sclera white.  No eye drainage.   Ears/Nose/Mouth/Throat: Nares patent, no nasal drainage.  Normal dentition, mucous membranes moist.   Neck: supple, no cervical lymphadenopathy, + thyromegaly. No tenderness or nodules.  Cardiovascular: regular rate, normal S1/S2, no murmurs Respiratory: No increased work of breathing.  Lungs clear to auscultation  bilaterally.  No wheezes. Abdomen: soft, nontender, nondistended. Normal bowel sounds.  No appreciable masses  Extremities: warm, well perfused, cap refill < 2 sec.   Musculoskeletal: Normal muscle mass.  Normal strength Skin: warm, dry.  No rash or lesions. + tremors to bilateral hands.  Neurologic: alert and oriented, normal speech, no tremor    Laboratory Evaluation:     Assessment/Plan:  Jodean Valade is a 19 y.o. female with hyperthyroidism/Graves disease. 25 mg of methimazole BID, remains clinically hyperthyroid. Due for labs today.   1. Hyperthyroidism 2. Thyromegaly 3. Tachycardia 4. Tremor -Discussed pituitary/thyroid axis and explained causes of hyperthyroidism, including Graves disease and Hasimoto's thyroiditis.   - 25 mg of atenolol as needed for symptom management  Or if HR is >100 - Do not exercise if HR is >100  - Reviewed s/s of hyperthyroidism and thyroid storm  - TSH, FT4, T4, T3 and CBC ordered.    Follow-up:   1 month.   Medical decision-making:  >30 spent today reviewing the medical chart, counseling the patient/family, and documenting today's visit.      Gretchen Short,  FNP-C  [facility]  [address]  [place], [address]  Tele: [phone]

## 2019-08-27 LAB — CBC WITH DIFFERENTIAL/PLATELET
Absolute Monocytes: 495 cells/uL (ref 200–950)
Basophils Absolute: 0 cells/uL (ref 0–200)
Basophils Relative: 0 %
Eosinophils Absolute: 0 cells/uL — ABNORMAL LOW (ref 15–500)
Eosinophils Relative: 0 %
HCT: 38.6 % (ref 35.0–45.0)
Hemoglobin: 12.1 g/dL (ref 11.7–15.5)
Lymphs Abs: 2007 cells/uL (ref 850–3900)
MCH: 22.6 pg — ABNORMAL LOW (ref 27.0–33.0)
MCHC: 31.3 g/dL — ABNORMAL LOW (ref 32.0–36.0)
MCV: 72 fL — ABNORMAL LOW (ref 80.0–100.0)
MPV: 9.7 fL (ref 7.5–12.5)
Monocytes Relative: 11 %
Neutro Abs: 1998 cells/uL (ref 1500–7800)
Neutrophils Relative %: 44.4 %
Platelets: 250 10*3/uL (ref 140–400)
RBC: 5.36 10*6/uL — ABNORMAL HIGH (ref 3.80–5.10)
RDW: 21 % — ABNORMAL HIGH (ref 11.0–15.0)
Total Lymphocyte: 44.6 %
WBC: 4.5 10*3/uL (ref 3.8–10.8)

## 2019-08-27 LAB — T3: T3, Total: 314 ng/dL — ABNORMAL HIGH (ref 86–192)

## 2019-08-27 LAB — T4, FREE: Free T4: 2 ng/dL — ABNORMAL HIGH (ref 0.8–1.4)

## 2019-08-27 LAB — T4: T4, Total: 12.3 ug/dL — ABNORMAL HIGH (ref 5.3–11.7)

## 2019-08-27 LAB — TSH: TSH: 0.01 mIU/L — ABNORMAL LOW

## 2019-08-31 ENCOUNTER — Other Ambulatory Visit (INDEPENDENT_AMBULATORY_CARE_PROVIDER_SITE_OTHER): Payer: Self-pay | Admitting: Family

## 2019-08-31 MED ORDER — METHIMAZOLE 10 MG PO TABS: 30.0000 mg | ORAL_TABLET | Freq: Two times a day (BID) | ORAL | 1 refills | Status: AC

## 2019-09-05 ENCOUNTER — Other Ambulatory Visit: Payer: Self-pay | Admitting: Pediatrics

## 2019-09-08 ENCOUNTER — Ambulatory Visit (INDEPENDENT_AMBULATORY_CARE_PROVIDER_SITE_OTHER): Payer: Medicaid Other | Admitting: Pediatrics

## 2019-09-08 ENCOUNTER — Encounter: Payer: Self-pay | Admitting: Pediatrics

## 2019-09-08 ENCOUNTER — Other Ambulatory Visit: Payer: Self-pay

## 2019-09-08 VITALS — Wt 133.6 lb

## 2019-09-08 DIAGNOSIS — H66011 Acute suppurative otitis media with spontaneous rupture of ear drum, right ear: Secondary | ICD-10-CM | POA: Diagnosis not present

## 2019-09-08 MED ORDER — AMOXICILLIN-POT CLAVULANATE 875-125 MG PO TABS
1.0000 | ORAL_TABLET | Freq: Two times a day (BID) | ORAL | 0 refills | Status: AC
Start: 2019-09-08 — End: 2019-09-18

## 2019-09-08 NOTE — Patient Instructions (Signed)
Otitis Media, Adult  Otitis media means that the middle ear is red and swollen (inflamed) and full of fluid. The condition usually goes away on its own. Follow these instructions at home:  Take over-the-counter and prescription medicines only as told by your doctor.  If you were prescribed an antibiotic medicine, take it as told by your doctor. Do not stop taking the antibiotic even if you start to feel better.  Keep all follow-up visits as told by your doctor. This is important. Contact a doctor if:  You have bleeding from your nose.  There is a lump on your neck.  You are not getting better in 5 days.  You feel worse instead of better. Get help right away if:  You have pain that is not helped with medicine.  You have swelling, redness, or pain around your ear.  You get a stiff neck.  You cannot move part of your face (paralyzed).  You notice that the bone behind your ear hurts when you touch it.  You get a very bad headache. Summary  Otitis media means that the middle ear is red, swollen, and full of fluid.  This condition usually goes away on its own. In some cases, treatment may be needed.  If you were prescribed an antibiotic medicine, take it as told by your doctor. This information is not intended to replace advice given to you by your health care provider. Make sure you discuss any questions you have with your health care provider. Document Revised: 12/27/2016 Document Reviewed: 02/05/2016 Elsevier Patient Education  2020 Elsevier Inc.  

## 2019-09-08 NOTE — Progress Notes (Signed)
Subjective:    [name] [name] is a 19 y.o. old female here with her self for Ear Pain (both ears, right worse)   HPI: [name] [name] presents with history of thinks ear infection in right ear.  She has been treated 1x last month.  History she reports of multiple ear infect.  She reports before that she treated with ear drops before.  She went on vation about 2 months ago and feels like she has had issues.  She felt right ear muffled 3 days ago and felt ear was draining clear fluid.  Ear pain started yesterday this morning left ear hurting.  Denies any recent swimming.  Denies any recent illness, cough and congestion, v/d, sore throat.  She reports as a child had issues with ears but not for a long time and did mbetter after she had surgery.     The following portions of the patient's history were reviewed and updated as appropriate: allergies, current medications, past family history, past medical history, past social history, past surgical history and problem list.  Review of Systems Pertinent items are noted in HPI.   Allergies: No Known Allergies   Current Outpatient Medications on File Prior to Visit  Medication Sig Dispense Refill   acetaminophen (TYLENOL) 325 MG tablet Take 2 tablets (650 mg total) by mouth every 4 (four) hours as needed for mild pain. (Patient not taking: Reported on 08/26/2019) 40 tablet 1   amphetamine-dextroamphetamine (ADDERALL XR) 30 MG 24 hr capsule Take 2 capsules (60 mg total) by mouth daily. (Patient not taking: Reported on 05/25/2019) 62 capsule 0   amphetamine-dextroamphetamine (ADDERALL XR) 30 MG 24 hr capsule Take 2 capsules (60 mg total) by mouth daily. 62 capsule 0   amphetamine-dextroamphetamine (ADDERALL XR) 30 MG 24 hr capsule Take 2 capsules (60 mg total) by mouth daily. 62 capsule 0   atenolol (TENORMIN) 25 MG tablet Take 1 tablet (25 mg total) by mouth daily. Take in the morning if your heart rate is greater than 100. 30 tablet 2   cetirizine (ZYRTEC) 10 MG  tablet Take 1 tablet (10 mg total) by mouth daily. (Patient not taking: Reported on 08/26/2019) 30 tablet 12   FERRETTS 325 (106 Fe) MG TABS tablet TAKE 1 TABLET (106 MG OF IRON TOTAL) BY MOUTH DAILY. 60 tablet 1   fluticasone (FLONASE) 50 MCG/ACT nasal spray Place 1 spray into both nostrils daily. (Patient not taking: Reported on 08/26/2019) 16 g 12   methimazole (TAPAZOLE) 10 MG tablet Take 3 tablets (30 mg total) by mouth 2 (two) times daily. 180 tablet 1   norethindrone (MICRONOR) 0.35 MG tablet Take 1 tablet (0.35 mg total) by mouth daily. (Patient not taking: Reported on 03/10/2019) 1 Package 11   No current facility-administered medications on file prior to visit.    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     Assessment:   [name] [name] is a 19 y.o. old female with  1. Acute suppurative otitis media of right ear with spontaneous rupture of tympanic membrane, recurrence not specified     Plan:   --Antibiotics given below x10 days.   --Supportive care and symptomatic treatment discussed for AOM.   --Motrin/tylenol for pain or fever. --[name] [name] is currently trying to find a Family physician.  Discussed with her to be persistent and call a few and follow up to try and get a new  patient appointment.  Pediatric endocrinology will see them till she transitions but once she finds a PCP they will be able to manage her medical care needs and make referrals.  If she continues to have ear infections and needs referral to ENT they will be able to refer.     Meds ordered this encounter  Medications   amoxicillin-clavulanate (AUGMENTIN) 875-125 MG tablet    Sig: Take 1 tablet by mouth 2 (two) times daily for 10 days.    Dispense:  20 tablet    Refill:  0     Return if symptoms worsen or fail to improve. in 2-3 days or prior for concerns  Myles Gip, DO

## 2019-10-05 ENCOUNTER — Telehealth: Payer: Self-pay | Admitting: Pediatrics

## 2019-10-05 MED ORDER — CEFDINIR 300 MG PO CAPS: 300.0000 mg | ORAL_CAPSULE | Freq: Two times a day (BID) | ORAL | 0 refills | Status: AC

## 2019-10-05 NOTE — Telephone Encounter (Signed)
[name] [name] was seen in the office 2.5 weeks ago and diagnosed with an ear infection. She was started on Augmentin, which seemed to help clear up the infection. She reports that within a few days of completing the antibiotic, her ear started to hurt again. Cefdinir sent to preferred pharmacy. If ear continues to have pain after this antibiotic, [name] will need to be seen in the office.

## 2019-10-05 NOTE — Telephone Encounter (Signed)
Patient would like to speak to you about meds

## 2019-10-11 ENCOUNTER — Other Ambulatory Visit: Payer: Self-pay

## 2019-10-11 ENCOUNTER — Ambulatory Visit (INDEPENDENT_AMBULATORY_CARE_PROVIDER_SITE_OTHER): Payer: Self-pay | Admitting: Pediatrics

## 2019-10-11 VITALS — BP 130/92 | Ht 64.0 in | Wt 129.9 lb

## 2019-10-11 DIAGNOSIS — Z79899 Other long term (current) drug therapy: Secondary | ICD-10-CM

## 2019-10-11 MED ORDER — ADDERALL XR 30 MG PO CP24: 60.0000 mg | ORAL_CAPSULE | Freq: Every day | ORAL | 0 refills | Status: DC

## 2019-10-11 NOTE — Progress Notes (Signed)
ADHD meds refilled after normal weight and Blood pressure. Doing well on present dose. See again in 3 months  

## 2019-10-13 ENCOUNTER — Ambulatory Visit (INDEPENDENT_AMBULATORY_CARE_PROVIDER_SITE_OTHER): Payer: Medicaid Other | Admitting: Family

## 2019-10-13 NOTE — Progress Notes (Deleted)
Pediatric Endocrinology Consultation Follow Up Visit  [name] [name] Dec 28, 2000  [name] June, NP  Chief Complaint: Hyperthyroid/Graves  History obtained from: [name] [name], and review of records from PCP  HPI: [name] [name]  is a 19 y.o. female being seen in consultation at the request of  [name], Pascal Lux, NP for evaluation of the above concerns.   1.  [name] [name] was seen by her adolescent medicine on 01/2018 where she was noted to have lmenorrhagia so labs were ordered. Her TSH resulted at 0.01 with a FT4 of 1.9. [name] Ramus, NP contacted me at that time and I advised to repeat labs with thyroid antibodies. Her second set of labs showed elevated TSI that is positive for Graves disease, her TSH had also remained at 0.01 with a higher FT4 of 2.5 and elevated T3 of 395. She was started on 5 mg of Methimazole BID and 25 mg of Atenolol daily for symptom control.  she is referred to [facility] ([facility]) for further evaluation.    2. Since her last visit to clinic on 07/2019, she has been well.   She is working most days of the week, will be starting college at [employer] (online). She is taking 25 mg of Methimazole BID, reports missing about 4-5 doses since her last visit. Rarely using the Atenolol    Thyroid symptoms: Heat or cold intolerance: Denies.   Weight changes: denies Energy level: Energy is good.  Sleep: Sleep is "normal" Skin changes: denies Constipation/Diarrhea: denies Difficulty swallowing: denies Neck swelling: Denies Periods regular: Lighter and more regular.  Tremor: "not much"  Palpitations: Denies    ROS: All systems reviewed with pertinent positives listed below; otherwise negative. Constitutional: Weight as above.  Sleeping better.  HEENT: No vision changes. No neck pain. No difficulty swallowing. Respiratory: No increased work of breathing currently Cardiac: intermittent tachycardia. No palpitations.  GI: No constipation or diarrhea, no  stomach aches.  GU: No nocturia. No polyuria.  Musculoskeletal: No joint deformity Neuro: Normal affect. Denies tremors currently.  Endocrine: As above   Past Medical History:  Past Medical History:  Diagnosis Date  . ADHD (attention deficit hyperactivity disorder) 12/31/2010  . Hearing loss   . 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 Delivered at term Discharged home with mom  Meds: Outpatient Encounter Medications as of 10/13/2019  Medication Sig  . acetaminophen (TYLENOL) 325 MG tablet Take 2 tablets (650 mg total) by mouth every 4 (four) hours as needed for mild pain. (Patient not taking: Reported on 08/26/2019)  . ADDERALL XR 30 MG 24 hr capsule Take 2 capsules (60 mg total) by mouth daily.  Melene Muller ON 11/11/2019] ADDERALL XR 30 MG 24 hr capsule Take 2 capsules (60 mg total) by mouth daily.  Melene Muller ON 12/12/2019] ADDERALL XR 30 MG 24 hr capsule Take 2 capsules (60 mg total) by mouth daily.  Marland Kitchen atenolol (TENORMIN) 25 MG tablet Take 1 tablet (25 mg total) by mouth daily. Take in the morning if your heart rate is greater than 100.  . cefdinir (OMNICEF) 300 MG capsule Take 1 capsule (300 mg total) by mouth 2 (two) times daily for 10 days.  . cetirizine (ZYRTEC) 10 MG tablet Take 1 tablet (10 mg total) by mouth daily. (Patient not taking: Reported on 08/26/2019)  . FERRETTS 325 (106 Fe) MG TABS tablet TAKE 1 TABLET (106 MG OF IRON TOTAL) BY MOUTH DAILY.  . fluticasone (FLONASE)  50 MCG/ACT nasal spray Place 1 spray into both nostrils daily. (Patient not taking: Reported on 08/26/2019)  . methimazole (TAPAZOLE) 10 MG tablet Take 3 tablets (30 mg total) by mouth 2 (two) times daily.  . norethindrone (MICRONOR) 0.35 MG tablet Take 1 tablet (0.35 mg total) by mouth daily. (Patient not taking: Reported on 03/10/2019)   No facility-administered encounter medications on file as of 10/13/2019.    Allergies: No Known  Allergies  Surgical History: Past Surgical History:  Procedure Laterality Date  . TYMPANOSTOMY TUBE PLACEMENT     twice    Family History:  Family History  Problem Relation Age of Onset  . ADD / ADHD Brother   . ADD / ADHD Brother   . Diabetes Brother   . ADD / ADHD Brother   . Allergies Brother   . Diabetes Maternal Grandmother   . Hypertension Maternal Grandmother   . Cancer Maternal Grandfather   . Alcohol abuse Neg Hx   . Arthritis Neg Hx   . Asthma Neg Hx   . Birth defects Neg Hx   . COPD Neg Hx   . Depression Neg Hx   . Drug abuse Neg Hx   . Early death Neg Hx   . Hearing loss Neg Hx   . Heart disease Neg Hx   . Hyperlipidemia Neg Hx   . Kidney disease Neg Hx   . Learning disabilities Neg Hx   . Mental illness Neg Hx   . Mental retardation Neg Hx   . Miscarriages / Stillbirths Neg Hx   . Stroke Neg Hx   . Vision loss Neg Hx   . Varicose Veins Neg Hx      Social History: Lives with: Grandmother, 3 brother and 2 nieces.    Physical Exam:  There were no vitals filed for this visit.  Body mass index: body mass index is unknown because there is no height or weight on file. Blood pressure percentiles are not available for patients who are 18 years or older.  Wt Readings from Last 3 Encounters:  10/11/19 129 lb 14.4 oz (58.9 kg) (53 %, Z= 0.08)*  09/08/19 133 lb 9.6 oz (60.6 kg) (60 %, Z= 0.25)*  08/26/19 133 lb 6.4 oz (60.5 kg) (60 %, Z= 0.24)*   * Growth percentiles are based on CDC (Girls, 2-20 Years) data.   Ht Readings from Last 3 Encounters:  10/11/19 5\' 4"  (1.626 m) (45 %, Z= -0.12)*  06/24/19 5' 4.37" (1.635 m) (51 %, Z= 0.03)*  05/25/19 5' 3.78" (1.62 m) (42 %, Z= -0.20)*   * Growth percentiles are based on CDC (Girls, 2-20 Years) data.     No weight on file for this encounter. No height on file for this encounter. No height and weight on file for this encounter.  General: Well developed, well nourished female in no acute distress.    Head: Normocephalic, atraumatic.   Eyes:  Pupils equal and round. EOMI.   Sclera white.  No eye drainage.   Ears/Nose/Mouth/Throat: Nares patent, no nasal drainage.  Normal dentition, mucous membranes moist.   Neck: supple, no cervical lymphadenopathy, no thyromegaly Cardiovascular: regular rate, normal S1/S2, no murmurs Respiratory: No increased work of breathing.  Lungs clear to auscultation bilaterally.  No wheezes. Abdomen: soft, nontender, nondistended. Normal bowel sounds.  No appreciable masses  Extremities: warm, well perfused, cap refill < 2 sec.   Musculoskeletal: Normal muscle mass.  Normal strength Skin: warm, dry.  No rash or lesions. Neurologic:  alert and oriented, normal speech, no tremor  Laboratory Evaluation:     Assessment/Plan:  [name] [name] is a 19 y.o. female with hyperthyroidism/Graves disease. 30 mg of methimazole BID, remains clinically hyperthyroid. Due for labs today.   1. Hyperthyroidism 2. Thyromegaly 3. Tachycardia 4. Tremor -Discussed pituitary/thyroid axis and explained causes of hyperthyroidism, including Graves disease and Hasimoto's thyroiditis.   - T4, FT4, TSH, T3 ordered  - 30 mcg of methimazole BID  - 25 mg of atenolol for symptom control Hold if HR is <100.  - Reviewed s/s of thyroid storm.   Follow-up:   1 month.   Medical decision-making:  >30 spent today reviewing the medical chart, counseling the patient/family, and documenting today's visit.      Gretchen Short,  FNP-C  [facility]ist  [address]  [place], [address]  Tele: [phone]

## 2019-10-19 ENCOUNTER — Encounter (INDEPENDENT_AMBULATORY_CARE_PROVIDER_SITE_OTHER): Payer: Self-pay | Admitting: Family

## 2019-10-19 ENCOUNTER — Other Ambulatory Visit: Payer: Self-pay

## 2019-10-19 ENCOUNTER — Ambulatory Visit (INDEPENDENT_AMBULATORY_CARE_PROVIDER_SITE_OTHER): Payer: Medicaid Other | Admitting: Family

## 2019-10-19 VITALS — BP 124/80 | HR 116 | Ht 64.13 in | Wt 128.2 lb

## 2019-10-19 DIAGNOSIS — R251 Tremor, unspecified: Secondary | ICD-10-CM

## 2019-10-19 DIAGNOSIS — E0590 Thyrotoxicosis, unspecified without thyrotoxic crisis or storm: Secondary | ICD-10-CM

## 2019-10-19 DIAGNOSIS — E0500 Thyrotoxicosis with diffuse goiter without thyrotoxic crisis or storm: Secondary | ICD-10-CM | POA: Diagnosis not present

## 2019-10-19 NOTE — Progress Notes (Signed)
Pediatric Endocrinology Consultation Follow Up Visit  Cino, Daune Feb 19, 2000  Estelle June, NP  Chief Complaint: Hyperthyroid/Graves  History obtained from: [place], and review of records from PCP  HPI: [name] [name]  is a 19 y.o. female being seen in consultation at the request of  [name], Pascal Lux, NP for evaluation of the above concerns.   1.  Deneane was seen by her adolescent medicine on 01/2018 where she was noted to have lmenorrhagia so labs were ordered. Her TSH resulted at 0.01 with a FT4 of 1.9. [name], NP contacted me at that time and I advised to repeat labs with thyroid antibodies. Her second set of labs showed elevated TSI that is positive for Graves disease, her TSH had also remained at 0.01 with a higher FT4 of 2.5 and elevated T3 of 395. She was started on 5 mg of Methimazole BID and 25 mg of Atenolol daily for symptom control.  she is [facility] to [facility] ([facility]) for further evaluation.    2. Since her last visit to clinic on 07/2019, she has been well.   She has started working at [employer], continuing to [employer]. Taking 30 mg of Methimazole twice per day, denies missing any doses. Has not used Atenolol since last visit.    Thyroid symptoms: Heat or cold intolerance: Denies.   Weight changes: denies Energy level: improved  Sleep: Sleep is "normal" Skin changes: denies Constipation/Diarrhea: denies Difficulty swallowing: denies Neck swelling: Denies Periods regular: "irregular but normal" Tremor: Only when she feels anxious.  Palpitations: Occasional but thinks its due to anxiety with testing   ROS: All systems reviewed with pertinent positives listed below; otherwise negative. Constitutional: Weight as above.  Sleeping better.  HEENT: No vision changes. No neck pain. No difficulty swallowing. Respiratory: No increased work of breathing currently Cardiac: intermittent tachycardia. No palpitations.  GI: No constipation or  diarrhea, no stomach aches.  GU: No nocturia. No polyuria.  Musculoskeletal: No joint deformity Neuro: Normal affect. Denies tremors currently.  Endocrine: As above   Past Medical History:  Past Medical History:  Diagnosis Date  . ADHD (attention deficit hyperactivity disorder) 12/31/2010  . Hearing loss   . 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 Delivered at term Discharged home with mom  Meds: Outpatient Encounter Medications as of 10/19/2019  Medication Sig  . ADDERALL XR 30 MG 24 hr capsule Take 2 capsules (60 mg total) by mouth daily.  Marland Kitchen FERRETTS 325 (106 Fe) MG TABS tablet TAKE 1 TABLET (106 MG OF IRON TOTAL) BY MOUTH DAILY.  . methimazole (TAPAZOLE) 10 MG tablet Take 3 tablets (30 mg total) by mouth 2 (two) times daily.  Marland Kitchen acetaminophen (TYLENOL) 325 MG tablet Take 2 tablets (650 mg total) by mouth every 4 (four) hours as needed for mild pain. (Patient not taking: Reported on 08/26/2019)  . [START ON 11/11/2019] ADDERALL XR 30 MG 24 hr capsule Take 2 capsules (60 mg total) by mouth daily. (Patient not taking: Reported on 10/19/2019)  . [START ON 12/12/2019] ADDERALL XR 30 MG 24 hr capsule Take 2 capsules (60 mg total) by mouth daily. (Patient not taking: Reported on 10/19/2019)  . atenolol (TENORMIN) 25 MG tablet Take 1 tablet (25 mg total) by mouth daily. Take in the morning if your heart rate is greater than 100.  . cetirizine (ZYRTEC) 10 MG tablet Take 1 tablet (10 mg total) by mouth daily. (Patient  not taking: Reported on 08/26/2019)  . fluticasone (FLONASE) 50 MCG/ACT nasal spray Place 1 spray into both nostrils daily. (Patient not taking: Reported on 08/26/2019)  . norethindrone (MICRONOR) 0.35 MG tablet Take 1 tablet (0.35 mg total) by mouth daily. (Patient not taking: Reported on 03/10/2019)   No facility-administered encounter medications on file as of 10/19/2019.    Allergies: No Known  Allergies  Surgical History: Past Surgical History:  Procedure Laterality Date  . TYMPANOSTOMY TUBE PLACEMENT     twice    Family History:  Family History  Problem Relation Age of Onset  . ADD / ADHD Brother   . ADD / ADHD Brother   . Diabetes Brother   . ADD / ADHD Brother   . Allergies Brother   . Diabetes Maternal Grandmother   . Hypertension Maternal Grandmother   . Cancer Maternal Grandfather   . Alcohol abuse Neg Hx   . Arthritis Neg Hx   . Asthma Neg Hx   . Birth defects Neg Hx   . COPD Neg Hx   . Depression Neg Hx   . Drug abuse Neg Hx   . Early death Neg Hx   . Hearing loss Neg Hx   . Heart disease Neg Hx   . Hyperlipidemia Neg Hx   . Kidney disease Neg Hx   . Learning disabilities Neg Hx   . Mental illness Neg Hx   . Mental retardation Neg Hx   . Miscarriages / Stillbirths Neg Hx   . Stroke Neg Hx   . Vision loss Neg Hx   . Varicose Veins Neg Hx      Social History: Lives with: Grandmother, 3 brother and 2 nieces.    Physical Exam:  Vitals:   10/19/19 0913  BP: 124/80  Pulse: (!) 116  Weight: 128 lb 3.2 oz (58.2 kg)  Height: 5' 4.13" (1.629 m)    Body mass index: body mass index is 21.91 kg/m. Blood pressure percentiles are not available for patients who are 18 years or older.  Wt Readings from Last 3 Encounters:  10/19/19 128 lb 3.2 oz (58.2 kg) (50 %, Z= 0.00)*  10/11/19 129 lb 14.4 oz (58.9 kg) (53 %, Z= 0.08)*  09/08/19 133 lb 9.6 oz (60.6 kg) (60 %, Z= 0.25)*   * Growth percentiles are based on CDC (Girls, 2-20 Years) data.   Ht Readings from Last 3 Encounters:  10/19/19 5' 4.13" (1.629 m) (47 %, Z= -0.07)*  10/11/19 5\' 4"  (1.626 m) (45 %, Z= -0.12)*  06/24/19 5' 4.37" (1.635 m) (51 %, Z= 0.03)*   * Growth percentiles are based on CDC (Girls, 2-20 Years) data.     50 %ile (Z= 0.00) based on CDC (Girls, 2-20 Years) weight-for-age data using vitals from 10/19/2019. 47 %ile (Z= -0.07) based on CDC (Girls, 2-20 Years)  Stature-for-age data based on Stature recorded on 10/19/2019. 52 %ile (Z= 0.06) based on CDC (Girls, 2-20 Years) BMI-for-age based on BMI available as of 10/19/2019.  General: Well developed, well nourished female in no acute distress.   Head: Normocephalic, atraumatic.   Eyes:  Pupils equal and round. EOMI.   Sclera white.  No eye drainage.   Ears/Nose/Mouth/Throat: Nares patent, no nasal drainage.  Normal dentition, mucous membranes moist.   Neck: supple, no cervical lymphadenopathy, no thyromegaly Cardiovascular: regular rate (88 during my exam), normal S1/S2, no murmurs Respiratory: No increased work of breathing.  Lungs clear to auscultation bilaterally.  No wheezes. Abdomen: soft, nontender, nondistended.  Normal bowel sounds.  No appreciable masses  Extremities: warm, well perfused, cap refill < 2 sec.   Musculoskeletal: Normal muscle mass.  Normal strength Skin: warm, dry.  No rash or lesions. Neurologic: alert and oriented, normal speech, no tremor  Laboratory Evaluation:     Assessment/Plan:  Kiyanna Biegler is a 19 y.o. female with hyperthyroidism/Graves disease. 30 mg of methimazole BID, she has clinical improvement Discussed other options that may be needed if labs do not start to improve such a thyroidectomy or radioactive iodine.   1. Hyperthyroidism 2. Thyromegaly 3. Tachycardia 4. Tremor -Discussed pituitary/thyroid axis and explained causes of hyperthyroidism, including Graves disease and Hasimoto's thyroiditis.   - T4, FT4, TSH, T3 ordered  - 30 mcg of methimazole BID  - 25 mg of atenolol for symptom control Hold if HR is <100.  - Reviewed s/s of thyroid storm.   Follow-up:   1 month.   Medical decision-making:  >30 spent today reviewing the medical chart, counseling the patient/family, and documenting today's visit.       Gretchen Short,  FNP-C  [facility]  [address]  [place], [address]  Tele: [phone]

## 2019-10-19 NOTE — Patient Instructions (Addendum)
30 mg of methimazole twice per day  Atenolol 25 mg as needed  No exercise if resting HR is >120  Labs today   - Consider radioactive iodine vs thyroid removal if we cannot get labs to improve.

## 2019-10-20 LAB — TSH: TSH: 0.01 mIU/L — ABNORMAL LOW

## 2019-10-20 LAB — T4: T4, Total: 7.2 ug/dL (ref 5.3–11.7)

## 2019-10-20 LAB — T3: T3, Total: 172 ng/dL (ref 86–192)

## 2019-10-20 LAB — T4, FREE: Free T4: 1.1 ng/dL (ref 0.8–1.4)

## 2019-11-22 ENCOUNTER — Encounter (INDEPENDENT_AMBULATORY_CARE_PROVIDER_SITE_OTHER): Payer: Self-pay

## 2019-11-22 ENCOUNTER — Telehealth (INDEPENDENT_AMBULATORY_CARE_PROVIDER_SITE_OTHER): Payer: Self-pay | Admitting: Family

## 2019-11-22 NOTE — Telephone Encounter (Signed)
  Who's calling (name and relationship to patient) : Karna ( self)  Best contact number: [phone]  Provider they see: Gretchen Short  Reason for call: Patient said she missed a call from our office. I do not have any aqppts any earlier and didn't see any documentation for a call from the clinical staff not sure why she received the call. She did ask if we did contact her to use My Chart she is at work and unable to answer but can text     PRESCRIPTION REFILL ONLY  Name of prescription:  Pharmacy:

## 2019-11-25 ENCOUNTER — Ambulatory Visit (INDEPENDENT_AMBULATORY_CARE_PROVIDER_SITE_OTHER): Payer: Medicaid Other | Admitting: Family

## 2019-12-16 ENCOUNTER — Encounter: Payer: Self-pay | Admitting: Pediatrics

## 2019-12-16 ENCOUNTER — Other Ambulatory Visit: Payer: Self-pay

## 2019-12-16 ENCOUNTER — Ambulatory Visit (INDEPENDENT_AMBULATORY_CARE_PROVIDER_SITE_OTHER): Payer: Medicaid Other | Admitting: Pediatrics

## 2019-12-16 VITALS — Wt 131.2 lb

## 2019-12-16 DIAGNOSIS — Z23 Encounter for immunization: Secondary | ICD-10-CM

## 2019-12-16 DIAGNOSIS — H66004 Acute suppurative otitis media without spontaneous rupture of ear drum, recurrent, right ear: Secondary | ICD-10-CM | POA: Diagnosis not present

## 2019-12-16 MED ORDER — CEFDINIR 300 MG PO CAPS: 300.0000 mg | ORAL_CAPSULE | Freq: Two times a day (BID) | ORAL | 0 refills | Status: AC

## 2019-12-16 NOTE — Patient Instructions (Signed)
300mg  Omnicef (cefdinir) 2 times a day for 10 days Referral to ENT for evaluation of recurrent ear infections Follow up as needed

## 2019-12-16 NOTE — Progress Notes (Signed)
Subjective:     [name] [name] is a 19 y.o. female who presents with ear pain and possible ear infection. Symptoms include: right ear drainage , right ear pain and plugged sensation in the right ear. Onset of symptoms was 9 days ago, and have been gradually worsening since that time. Associated symptoms include: none.  Patient denies: achiness, chills, congestion, coryza, fever , headache, low grade fever, non productive cough, post nasal drip, productive cough, sinus pressure, sneezing and sore throat. She is drinking plenty of fluids.  The following portions of the patient's history were reviewed and updated as appropriate: allergies, current medications, past family history, past medical history, past social history, past surgical history and problem list.  Review of Systems Pertinent items are noted in HPI.   Objective:    Wt 131 lb 3 oz (59.5 kg)   BMI 22.42 kg/m  General:  alert, cooperative, appears stated age and no distress  Right Ear: TM dull, bulging, erythematous  Left Ear: normal landmarks and mobility  Mouth:  lips, mucosa, and tongue normal; teeth and gums normal  Neck: no adenopathy, no carotid bruit, no JVD, supple, symmetrical, trachea midline and thyroid not enlarged, symmetric, no tenderness/mass/nodules     Assessment:    Right acute otitis media   Plan:    Treatment: Cefdinir. OTC analgesia as needed. Fluids, rest, avoid carbonated/alcoholic and caffeinated beverages.  Follow up in 4 days if not improving.   Referred to ENT for recurrent AOM in right ear. 4th infection in 5 months Flu vaccine per orders. Indications, contraindications and side effects of vaccine/vaccines discussed with parent and parent verbally expressed understanding and also agreed with the administration of vaccine/vaccines as ordered above today.Handout (VIS) given for each vaccine at this visit.
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
## 2019-12-30 ENCOUNTER — Ambulatory Visit (INDEPENDENT_AMBULATORY_CARE_PROVIDER_SITE_OTHER): Payer: Medicaid Other | Admitting: Family

## 2019-12-30 ENCOUNTER — Encounter (INDEPENDENT_AMBULATORY_CARE_PROVIDER_SITE_OTHER): Payer: Self-pay | Admitting: Family

## 2019-12-30 ENCOUNTER — Other Ambulatory Visit: Payer: Self-pay

## 2019-12-30 VITALS — BP 130/80 | HR 120 | Wt 125.6 lb

## 2019-12-30 DIAGNOSIS — R000 Tachycardia, unspecified: Secondary | ICD-10-CM

## 2019-12-30 DIAGNOSIS — E0500 Thyrotoxicosis with diffuse goiter without thyrotoxic crisis or storm: Secondary | ICD-10-CM

## 2019-12-30 DIAGNOSIS — E0590 Thyrotoxicosis, unspecified without thyrotoxic crisis or storm: Secondary | ICD-10-CM | POA: Diagnosis not present

## 2019-12-30 NOTE — Patient Instructions (Addendum)
-  Signs of hypothyroidism (underactive thyroid) include increased sleep, sluggishness, weight gain, and constipation. -Signs of hyperthyroidism (overactive thyroid) include difficulty sleeping, diarrhea, heart racing, weight loss, or irritability  Please let me know if you develop any of these symptoms so we can repeat your thyroid tests.  25 mcg of methimazole BID

## 2019-12-30 NOTE — Progress Notes (Signed)
Pediatric Endocrinology Consultation Follow Up Visit  Gotsch, Pranika [birth year]  Estelle June, NP  Chief Complaint: Hyperthyroid/Graves  History obtained from: [place], and review of records from PCP  HPI: [name] [name]  is a 19 y.o. female being seen in consultation at the request of  [name], Pascal Lux, NP for evaluation of the above concerns.   1.  Darrah was seen by her adolescent medicine on 01/2018 where she was noted to have lmenorrhagia so labs were ordered. Her TSH resulted at 0.01 with a FT4 of 1.9. [name], NP contacted me at that time and I advised to repeat labs with thyroid antibodies. Her second set of labs showed elevated TSI that is positive for Graves disease, her TSH had also remained at 0.01 with a higher FT4 of 2.5 and elevated T3 of 395. She was started on 5 mg of Methimazole BID and 25 mg of Atenolol daily for symptom control.  she is referred to [facility] ([facility]) for further evaluation.    2. Since her last visit to clinic on 09/2019, she has been well.   She is taking 25 mcg of levothyroxine twice per day. She reports that she has missed a couple doses because [place] was [place] and [place] her medication. Estimates taking atenolol about once per month.   Thyroid symptoms: Heat or cold intolerance: Denies.   Weight changes: unsure. (3 lbs loss)   Energy level: Better  Sleep: same  Skin changes: denies Constipation/Diarrhea: denies Difficulty swallowing: denies Neck swelling: Denies Periods regular: more regular  Tremor: Denies  Palpitations: Denies    ROS: All systems reviewed with pertinent positives listed below; otherwise negative. Constitutional: 3 lbs weight loss which she reports is from stress.  HEENT: No vision changes. No neck pain. No difficulty swallowing. Respiratory: No increased work of breathing currently Cardiac: intermittent tachycardia. No palpitations.  GI: No constipation or diarrhea, no stomach aches.  GU:  No nocturia. No polyuria.  Musculoskeletal: No joint deformity Neuro: Normal affect. Denies tremors currently.  Endocrine: As above   Past Medical History:  Past Medical History:  Diagnosis Date  . ADHD (attention deficit hyperactivity disorder) 12/31/2010  . Hearing loss   . 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 Delivered at term Discharged home with mom  Meds: Outpatient Encounter Medications as of 12/30/2019  Medication Sig  . FERRETTS 325 (106 Fe) MG TABS tablet TAKE 1 TABLET (106 MG OF IRON TOTAL) BY MOUTH DAILY.  . methimazole (TAPAZOLE) 10 MG tablet Take 3 tablets (30 mg total) by mouth 2 (two) times daily.  Marland Kitchen acetaminophen (TYLENOL) 325 MG tablet Take 2 tablets (650 mg total) by mouth every 4 (four) hours as needed for mild pain. (Patient not taking: Reported on 08/26/2019)  . ADDERALL XR 30 MG 24 hr capsule Take 2 capsules (60 mg total) by mouth daily.  . ADDERALL XR 30 MG 24 hr capsule Take 2 capsules (60 mg total) by mouth daily. (Patient not taking: Reported on 10/19/2019)  . ADDERALL XR 30 MG 24 hr capsule Take 2 capsules (60 mg total) by mouth daily. (Patient not taking: Reported on 10/19/2019)  . atenolol (TENORMIN) 25 MG tablet Take 1 tablet (25 mg total) by mouth daily. Take in the morning if your heart rate is greater than 100.  . cetirizine (ZYRTEC) 10 MG tablet Take 1 tablet (10 mg total) by mouth daily. (Patient not taking: Reported on 08/26/2019)  .  fluticasone (FLONASE) 50 MCG/ACT nasal spray Place 1 spray into both nostrils daily. (Patient not taking: Reported on 08/26/2019)  . norethindrone (MICRONOR) 0.35 MG tablet Take 1 tablet (0.35 mg total) by mouth daily. (Patient not taking: Reported on 03/10/2019)   No facility-administered encounter medications on file as of 12/30/2019.    Allergies: No Known Allergies  Surgical History: Past Surgical History:  Procedure Laterality Date  .  TYMPANOSTOMY TUBE PLACEMENT     twice    Family History:  Family History  Problem Relation Age of Onset  . ADD / ADHD Brother   . ADD / ADHD Brother   . Diabetes Brother   . ADD / ADHD Brother   . Allergies Brother   . Diabetes Maternal Grandmother   . Hypertension Maternal Grandmother   . Cancer Maternal Grandfather   . Alcohol abuse Neg Hx   . Arthritis Neg Hx   . Asthma Neg Hx   . Birth defects Neg Hx   . COPD Neg Hx   . Depression Neg Hx   . Drug abuse Neg Hx   . Early death Neg Hx   . Hearing loss Neg Hx   . Heart disease Neg Hx   . Hyperlipidemia Neg Hx   . Kidney disease Neg Hx   . Learning disabilities Neg Hx   . Mental illness Neg Hx   . Mental retardation Neg Hx   . Miscarriages / Stillbirths Neg Hx   . Stroke Neg Hx   . Vision loss Neg Hx   . Varicose Veins Neg Hx      Social History: Lives with: Grandmother, 3 brother and 2 nieces.    Physical Exam:  Vitals:   12/30/19 0919  BP: 130/80  Pulse: (!) 120  Weight: 125 lb 9.6 oz (57 kg)    Body mass index: body mass index is 21.47 kg/m. Growth percentile SmartLinks can only be used for patients less than 90 years old.  Wt Readings from Last 3 Encounters:  12/30/19 125 lb 9.6 oz (57 kg)  12/16/19 131 lb 3 oz (59.5 kg)  10/19/19 128 lb 3.2 oz (58.2 kg) (50 %, Z= 0.00)*   * Growth percentiles are based on CDC (Girls, 2-20 Years) data.   Ht Readings from Last 3 Encounters:  10/19/19 5' 4.13" (1.629 m) (47 %, Z= -0.07)*  10/11/19 5\' 4"  (1.626 m) (45 %, Z= -0.12)*  06/24/19 5' 4.37" (1.635 m) (51 %, Z= 0.03)*   * Growth percentiles are based on CDC (Girls, 2-20 Years) data.     Facility age limit for growth percentiles is 20 years. Facility age limit for growth percentiles is 20 years. Facility age limit for growth percentiles is 20 years.  General: Well developed, well nourished female in no acute distress.   Head: Normocephalic, atraumatic.   Eyes:  Pupils equal and round. EOMI.   Sclera  white.  No eye drainage.   Ears/Nose/Mouth/Throat: Nares patent, no nasal drainage.  Normal dentition, mucous membranes moist.   Neck: supple, no cervical lymphadenopathy, no thyromegaly Cardiovascular: regular rate, normal S1/S2, no murmurs Respiratory: No increased work of breathing.  Lungs clear to auscultation bilaterally.  No wheezes. Abdomen: soft, nontender, nondistended. Normal bowel sounds.  No appreciable masses  Extremities: warm, well perfused, cap refill < 2 sec.   Musculoskeletal: Normal muscle mass.  Normal strength Skin: warm, dry.  No rash or lesions. Neurologic: alert and oriented, normal speech, + tremor to bilateral hands (mild)    Laboratory  Evaluation:     Assessment/Plan:  Donnie Gedeon is a 19 y.o. female with hyperthyroidism/Graves disease. She is clinically improving on 25 mcg of levothyroxine twice per day. Labs ordered.     1. Hyperthyroidism 2. Thyromegaly 3. Tachycardia 4. Tremor -Discussed pituitary/thyroid axis and explained causes of hyperthyroidism, including Graves disease and Hasimoto's thyroiditis.   - 25 mcg of Methimazole BID  - 25 mg of atenolol as needed daily for symptom management. Hold if HR is <100  - No activity if resting HR is >120  - TSH, FT4, T4 and T3 ordered  - reviewed growth chart.   Follow-up:   1 month.   Medical decision-making:  >30  spent today reviewing the medical chart, counseling the patient/family, and documenting today's visit.         Gretchen Short,  FNP-C  [facility]ist  [address]  [place], [address]  Tele: [phone]

## 2019-12-31 IMAGING — CR DG CHEST 2V
2 series · 2 of 2 positions shown · non-contrast
Comparison: August 29, 2017

CLINICAL DATA: Recent pneumothorax

EXAM:
CHEST - 2 VIEW

[w chest pa]
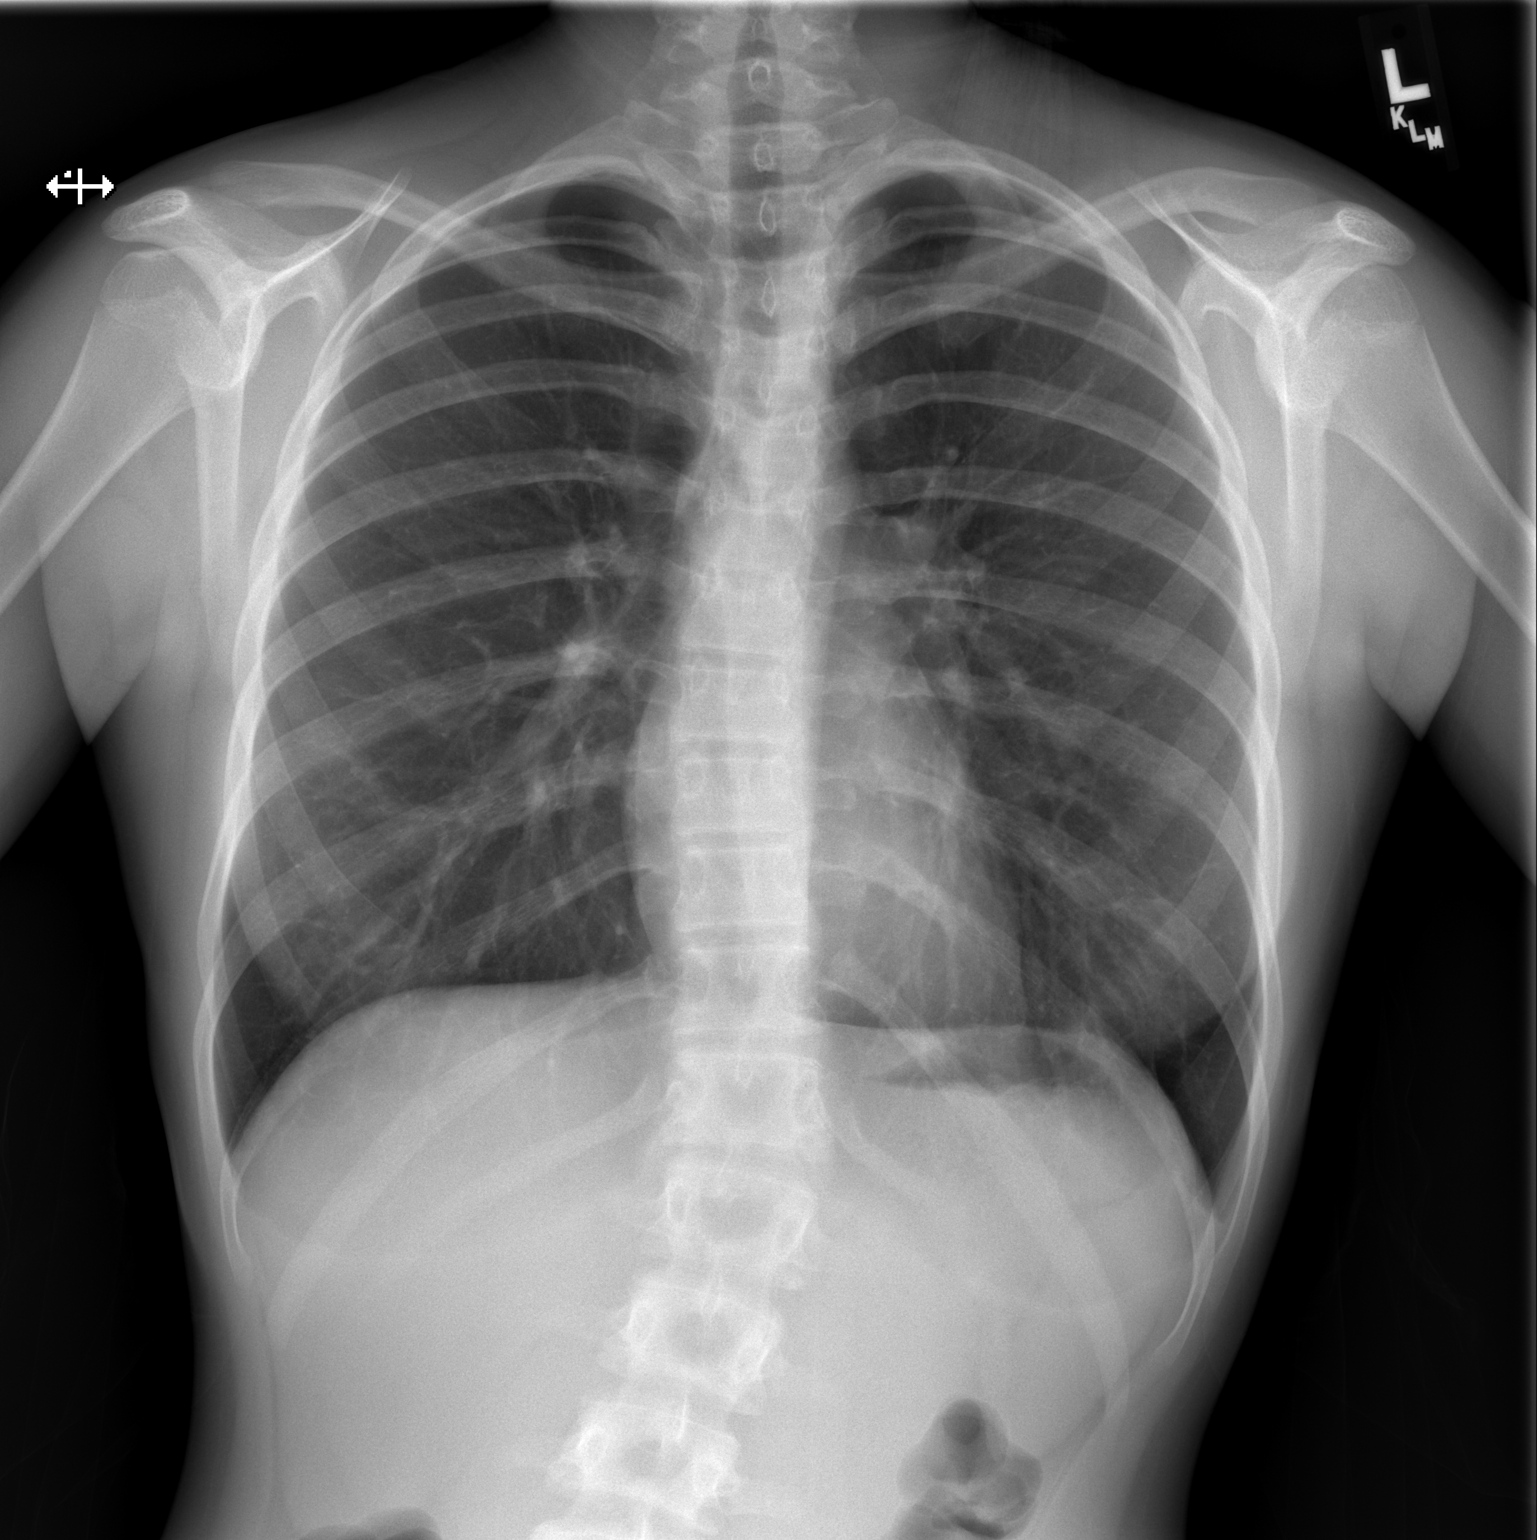

[w chest lat]
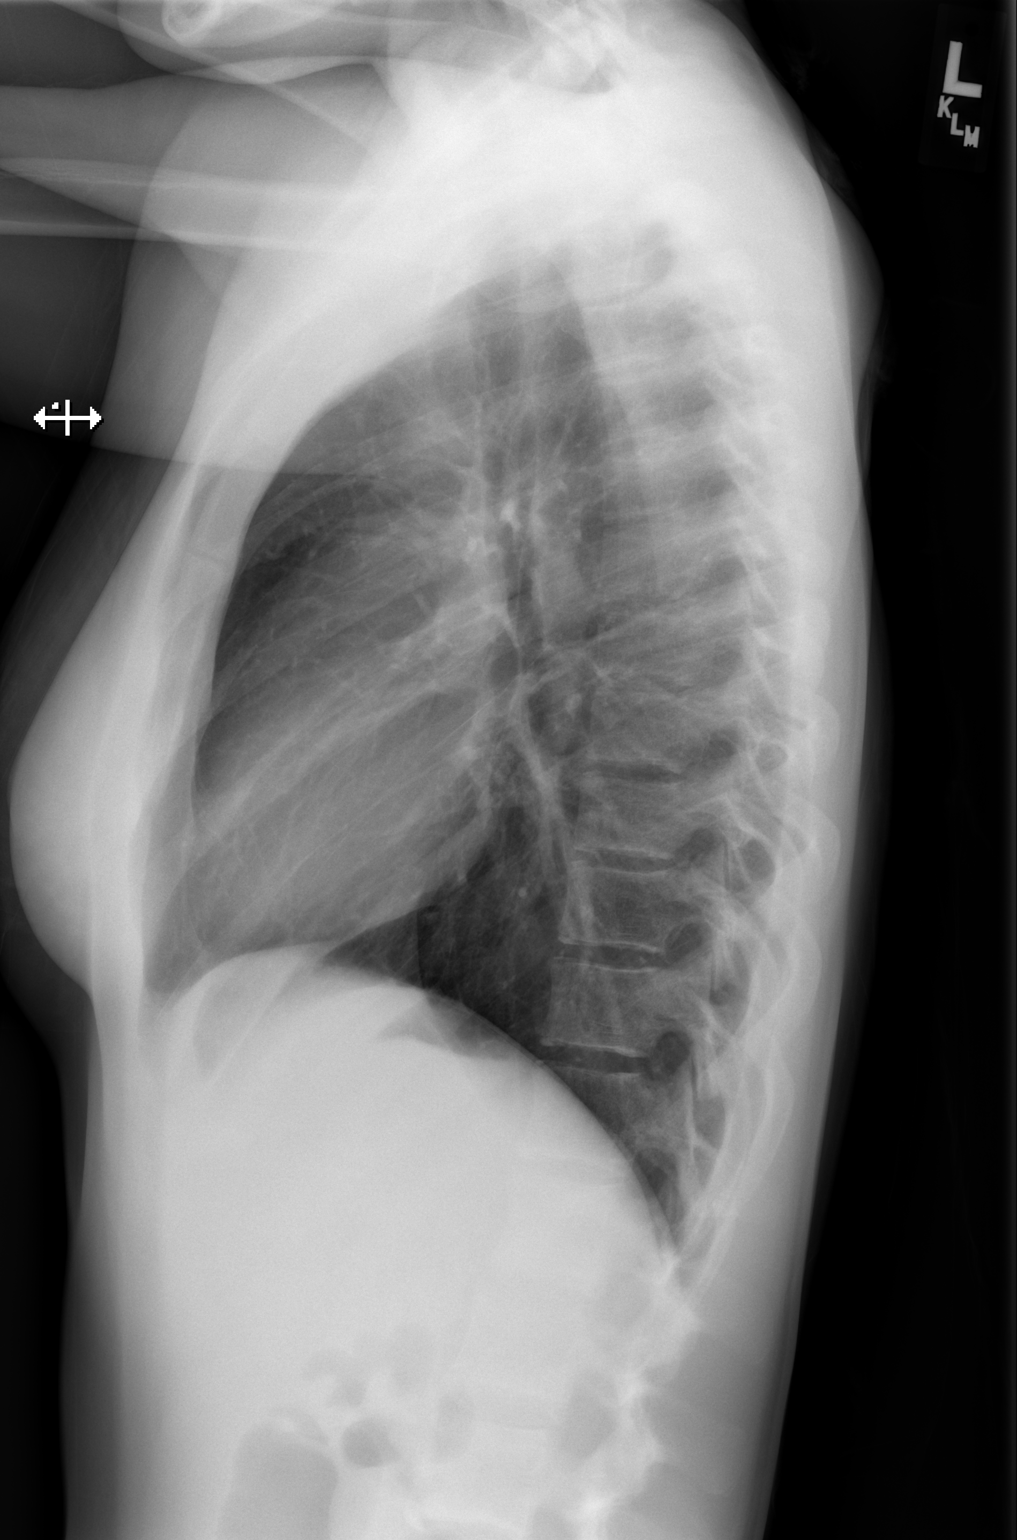

[2 of 2 positions shown; findings below may reference images not displayed]

FINDINGS: Previous right pneumothorax has resolved. No pneumothorax is
currently evident. Lungs are clear. Heart size and pulmonary
vascularity are normal. No adenopathy. There is lumbar
dextroscoliosis.
IMPRESSION: No pneumothorax evident currently. No edema or consolidation. Heart
size normal.

## 2020-01-01 LAB — T4, FREE: Free T4: 2.8 ng/dL — ABNORMAL HIGH (ref 0.8–1.4)

## 2020-01-01 LAB — T3: T3, Total: 283 ng/dL — ABNORMAL HIGH (ref 86–192)

## 2020-01-01 LAB — T4: T4, Total: 14.4 ug/dL — ABNORMAL HIGH (ref 5.3–11.7)

## 2020-01-01 LAB — TSH: TSH: <0.01 mIU/L — ABNORMAL LOW

## 2020-02-07 ENCOUNTER — Telehealth: Payer: Self-pay

## 2020-02-07 MED ORDER — ADDERALL XR 30 MG PO CP24: 60.0000 mg | ORAL_CAPSULE | Freq: Every day | ORAL | 0 refills | Status: DC

## 2020-02-07 NOTE — Telephone Encounter (Signed)
[name] [name] would you please call Melis and talk to her about her add meds for ADD. She would not listen to what you told me to tell her.

## 2020-02-07 NOTE — Telephone Encounter (Signed)
[name] [name] is out of Adderall XR 30mg  and having a very hard time staying focused in her classes. She has been looking for an adult/family practice to transition care to. Recommended she call [facility], phone number for both [place] locations given to [name]. Will send in 1 month supply Adderall to preferred pharmacy to bridge care gap.

## 2020-02-08 ENCOUNTER — Telehealth: Payer: Self-pay | Admitting: Pediatrics

## 2020-02-08 MED ORDER — ADDERALL XR 30 MG PO CP24: 60.0000 mg | ORAL_CAPSULE | Freq: Every day | ORAL | 0 refills | Status: AC

## 2020-02-08 NOTE — Telephone Encounter (Signed)
[name] [name] was able to get an appointment at [facility] April 17, 2020. She will need enough Adderall to cover until that appointment. One month prescription sent to pharmacy yesterday. Will send 2- 1 month supply prescriptions to preferred pharmacy.

## 2020-02-22 ENCOUNTER — Ambulatory Visit (INDEPENDENT_AMBULATORY_CARE_PROVIDER_SITE_OTHER): Payer: Medicaid Other | Admitting: Family

## 2020-02-22 ENCOUNTER — Other Ambulatory Visit: Payer: Self-pay

## 2020-02-22 ENCOUNTER — Encounter (INDEPENDENT_AMBULATORY_CARE_PROVIDER_SITE_OTHER): Payer: Self-pay | Admitting: Family

## 2020-02-22 VITALS — BP 130/78 | HR 120 | Wt 122.2 lb

## 2020-02-22 DIAGNOSIS — R251 Tremor, unspecified: Secondary | ICD-10-CM

## 2020-02-22 DIAGNOSIS — E0500 Thyrotoxicosis with diffuse goiter without thyrotoxic crisis or storm: Secondary | ICD-10-CM | POA: Diagnosis not present

## 2020-02-22 DIAGNOSIS — E0590 Thyrotoxicosis, unspecified without thyrotoxic crisis or storm: Secondary | ICD-10-CM

## 2020-02-22 DIAGNOSIS — R000 Tachycardia, unspecified: Secondary | ICD-10-CM | POA: Diagnosis not present

## 2020-02-22 NOTE — Patient Instructions (Signed)
YOU HAVE TO TAKE YOUR MEDICINE   - Set alarm for morning and dinner dose.   - Labs today   1 month follow up

## 2020-02-22 NOTE — Progress Notes (Signed)
Pediatric Endocrinology Consultation Follow Up Visit  [name], Wave 21-Jun-2000  [name] June, NP  Chief Complaint: Hyperthyroid/Graves  History obtained from: [place], and review of records from PCP  HPI: [name] [name]  is a 20 y.o. female being seen in consultation at the request of  [name], Pascal Lux, NP for evaluation of the above concerns.   1.  [name] [name] was seen by her adolescent medicine on 01/2018 where she was noted to have lmenorrhagia so labs were ordered. Her TSH resulted at 0.01 with a FT4 of 1.9. [name], NP contacted me at that time and I advised to repeat labs with thyroid antibodies. Her second set of labs showed elevated TSI that is positive for Graves disease, her TSH had also remained at 0.01 with a higher FT4 of 2.5 and elevated T3 of 395. She was started on 5 mg of Methimazole BID and 25 mg of Atenolol daily for symptom control.  [facility] ([facility] for further evaluation.    2. Since her last visit to clinic on 12/2019, she has been well.   She has not been taking methimazole consistently (suppose to take 25 mcg BID). She states there has been a lot going on which has made it more difficult for her. She does better taking the morning dose but frequently falls asleep at night before taking pm dose. She has not needed to take atenolol.    Thyroid symptoms: Heat or cold intolerance: Denies.   Weight changes: unsure. (3 lbs loss)   Energy level: Better  Sleep: Not good. Thinks some may be due to stress.  Skin changes: denies Constipation/Diarrhea: denies Difficulty swallowing: denies Neck swelling: Denies Periods regular: more regular  Tremor: Denies  Palpitations: "only when I forget medication"    ROS: All systems reviewed with pertinent positives listed below; otherwise negative. Constitutional: 3 lbs weight loss  HEENT: No vision changes. No neck pain. No difficulty swallowing. Respiratory: No increased work of  breathing currently Cardiac: intermittent tachycardia. No palpitations.  GI: No constipation or diarrhea, no stomach aches.  GU: No nocturia. No polyuria.  Musculoskeletal: No joint deformity Neuro: Normal affect. + tremors  Endocrine: As above   Past Medical History:  Past Medical History:  Diagnosis Date  . ADHD (attention deficit hyperactivity disorder) 12/31/2010  . Hearing loss   . 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 Delivered at term Discharged home with mom  Meds: Outpatient Encounter Medications as of 02/22/2020  Medication Sig  . [START ON 03/09/2020] ADDERALL XR 30 MG 24 hr capsule Take 2 capsules (60 mg total) by mouth daily.  Marland Kitchen FERRETTS 325 (106 Fe) MG TABS tablet TAKE 1 TABLET (106 MG OF IRON TOTAL) BY MOUTH DAILY.  . methimazole (TAPAZOLE) 10 MG tablet Take 3 tablets (30 mg total) by mouth 2 (two) times daily.  Marland Kitchen acetaminophen (TYLENOL) 325 MG tablet Take 2 tablets (650 mg total) by mouth every 4 (four) hours as needed for mild pain. (Patient not taking: No sig reported)  . [START ON 04/06/2020] ADDERALL XR 30 MG 24 hr capsule Take 2 capsules (60 mg total) by mouth daily. (Patient not taking: Reported on 02/22/2020)  . atenolol (TENORMIN) 25 MG tablet Take 1 tablet (25 mg total) by mouth daily. Take in the morning if your heart rate is greater than 100.  . cetirizine (ZYRTEC) 10 MG tablet Take 1 tablet (10 mg total) by mouth daily. (  Patient not taking: No sig reported)  . fluticasone (FLONASE) 50 MCG/ACT nasal spray Place 1 spray into both nostrils daily. (Patient not taking: No sig reported)  . norethindrone (MICRONOR) 0.35 MG tablet Take 1 tablet (0.35 mg total) by mouth daily. (Patient not taking: No sig reported)   No facility-administered encounter medications on file as of 02/22/2020.    Allergies: No Known Allergies  Surgical History: Past Surgical History:  Procedure Laterality Date   . TYMPANOSTOMY TUBE PLACEMENT     twice    Family History:  Family History  Problem Relation Age of Onset  . ADD / ADHD Brother   . ADD / ADHD Brother   . Diabetes Brother   . ADD / ADHD Brother   . Allergies Brother   . Diabetes Maternal Grandmother   . Hypertension Maternal Grandmother   . Cancer Maternal Grandfather   . Alcohol abuse Neg Hx   . Arthritis Neg Hx   . Asthma Neg Hx   . Birth defects Neg Hx   . COPD Neg Hx   . Depression Neg Hx   . Drug abuse Neg Hx   . Early death Neg Hx   . Hearing loss Neg Hx   . Heart disease Neg Hx   . Hyperlipidemia Neg Hx   . Kidney disease Neg Hx   . Learning disabilities Neg Hx   . Mental illness Neg Hx   . Mental retardation Neg Hx   . Miscarriages / Stillbirths Neg Hx   . Stroke Neg Hx   . Vision loss Neg Hx   . Varicose Veins Neg Hx      Social History: Lives with: Grandmother, 3 brother and 2 nieces.    Physical Exam:  Vitals:   02/22/20 0841  BP: 130/78  Pulse: (!) 120  Weight: 122 lb 3.2 oz (55.4 kg)    Body mass index: body mass index is 20.89 kg/m. Growth percentile SmartLinks can only be used for patients less than 19 years old.  Wt Readings from Last 3 Encounters:  02/22/20 122 lb 3.2 oz (55.4 kg)  12/30/19 125 lb 9.6 oz (57 kg)  12/16/19 131 lb 3 oz (59.5 kg)   Ht Readings from Last 3 Encounters:  10/19/19 5' 4.13" (1.629 m) (47 %, Z= -0.07)*  10/11/19 5\' 4"  (1.626 m) (45 %, Z= -0.12)*  06/24/19 5' 4.37" (1.635 m) (51 %, Z= 0.03)*   * Growth percentiles are based on CDC (Girls, 2-20 Years) data.     Facility age limit for growth percentiles is 20 years. Facility age limit for growth percentiles is 20 years. Facility age limit for growth percentiles is 20 years.  General: Well developed, well nourished female in no acute distress.   Head: Normocephalic, atraumatic.   Eyes:  Pupils equal and round. EOMI.   Sclera white.  No eye drainage.   Ears/Nose/Mouth/Throat: Nares patent, no nasal  drainage.  Normal dentition, mucous membranes moist.   Neck: supple, no cervical lymphadenopathy, no thyromegaly Cardiovascular: regular rate(94 per my count), normal S1/S2, no murmurs Respiratory: No increased work of breathing.  Lungs clear to auscultation bilaterally.  No wheezes. Abdomen: soft, nontender, nondistended. Normal bowel sounds.  No appreciable masses  Extremities: warm, well perfused, cap refill < 2 sec.   Musculoskeletal: Normal muscle mass.  Normal strength Skin: warm, dry.  No rash or lesions. Neurologic: alert and oriented, normal speech, no tremor  Laboratory Evaluation:     Assessment/Plan:  [name] [name] is a 19  y.o. female with hyperthyroidism/Graves disease. Struggling with compliance with medications. I discussed options including thyroidectomy today. At this time she wants to try to improve compliance. She is clinically hyperthyroid.    1. Hyperthyroidism 2. Thyromegaly 3. Tachycardia 4. Tremor - 25 mcg of methimazole BID  - Atenolol once daily PRN for symptom control  - TSH, FT4, T4 and T3 ordered  - Discussed signs of thyroid storm and possible reaction to methimazole including when to contact clinic.     Follow-up:   1 month.   Medical decision-making:  >30  spent today reviewing the medical chart, counseling the patient/family, and documenting today's visit.          Gretchen Short,  FNP-C  [facility]  [address]  [place], [address]  Tele: [phone]

## 2020-02-23 LAB — CBC WITH DIFFERENTIAL/PLATELET
Absolute Monocytes: 553 cells/uL (ref 200–950)
Basophils Absolute: 23 cells/uL (ref 0–200)
Basophils Relative: 0.4 %
Eosinophils Absolute: 103 cells/uL (ref 15–500)
Eosinophils Relative: 1.8 %
HCT: 40 % (ref 35.0–45.0)
Hemoglobin: 13.3 g/dL (ref 11.7–15.5)
Lymphs Abs: 2366 cells/uL (ref 850–3900)
MCH: 25.1 pg — ABNORMAL LOW (ref 27.0–33.0)
MCHC: 33.3 g/dL (ref 32.0–36.0)
MCV: 75.6 fL — ABNORMAL LOW (ref 80.0–100.0)
MPV: 11 fL (ref 7.5–12.5)
Monocytes Relative: 9.7 %
Neutro Abs: 2656 cells/uL (ref 1500–7800)
Neutrophils Relative %: 46.6 %
Platelets: 257 10*3/uL (ref 140–400)
RBC: 5.29 10*6/uL — ABNORMAL HIGH (ref 3.80–5.10)
RDW: 14.8 % (ref 11.0–15.0)
Total Lymphocyte: 41.5 %
WBC: 5.7 10*3/uL (ref 3.8–10.8)

## 2020-02-23 LAB — T3: T3, Total: 182 ng/dL (ref 86–192)

## 2020-02-23 LAB — TSH: TSH: <0.01 mIU/L — ABNORMAL LOW

## 2020-02-23 LAB — T4, FREE: Free T4: 2.2 ng/dL — ABNORMAL HIGH (ref 0.8–1.4)

## 2020-02-23 LAB — T4: T4, Total: 12 ug/dL — ABNORMAL HIGH (ref 5.3–11.7)

## 2020-03-22 ENCOUNTER — Ambulatory Visit (INDEPENDENT_AMBULATORY_CARE_PROVIDER_SITE_OTHER): Payer: Medicaid Other | Admitting: Family

## 2020-03-22 ENCOUNTER — Encounter (INDEPENDENT_AMBULATORY_CARE_PROVIDER_SITE_OTHER): Payer: Self-pay | Admitting: Family

## 2020-03-22 ENCOUNTER — Other Ambulatory Visit: Payer: Self-pay

## 2020-03-22 VITALS — BP 122/80 | HR 140 | Wt 126.0 lb

## 2020-03-22 DIAGNOSIS — E0500 Thyrotoxicosis with diffuse goiter without thyrotoxic crisis or storm: Secondary | ICD-10-CM | POA: Diagnosis not present

## 2020-03-22 DIAGNOSIS — E0590 Thyrotoxicosis, unspecified without thyrotoxic crisis or storm: Secondary | ICD-10-CM

## 2020-03-22 DIAGNOSIS — R000 Tachycardia, unspecified: Secondary | ICD-10-CM | POA: Diagnosis not present

## 2020-03-22 DIAGNOSIS — R251 Tremor, unspecified: Secondary | ICD-10-CM | POA: Diagnosis not present

## 2020-03-22 MED ORDER — ATENOLOL 25 MG PO TABS: 25.0000 mg | ORAL_TABLET | Freq: Every day | ORAL | 2 refills | Status: AC

## 2020-03-22 NOTE — Patient Instructions (Signed)
-  Signs of hypothyroidism (underactive thyroid) include increased sleep, sluggishness, weight gain, and constipation. -Signs of hyperthyroidism (overactive thyroid) include difficulty sleeping, diarrhea, heart racing, weight loss, or irritability  Please let me know if you develop any of these symptoms so we can repeat your thyroid tests.   - 25 mcg of methimazole BID  - Take 25 mg of atenolol once daily if symptomatic and HR is >100

## 2020-03-22 NOTE — Progress Notes (Signed)
Pediatric Endocrinology Consultation Follow Up Visit  [name] [name], [name] [name] 2000-09-25  Estelle June, NP  Chief Complaint: Hyperthyroid/Graves  History obtained from: [name], and review of records from PCP  HPI: [name] [name]  is a 20 y.o. female being seen in consultation at the request of  [name], Pascal Lux, NP for evaluation of the above concerns.   1.  [name] [name] was seen by her adolescent medicine on 01/2018 where she was noted to have lmenorrhagia so labs were ordered. Her TSH resulted at 0.01 with a FT4 of 1.9. [name], NP contacted me at that time and I advised to repeat labs with thyroid antibodies. Her second set of labs showed elevated TSI that is positive for Graves disease, her TSH had also remained at 0.01 with a higher FT4 of 2.5 and elevated T3 of 395. She was started on 5 mg of Methimazole BID and 25 mg of Atenolol daily for symptom control.  [facility] for further evaluation.    2. Since her last visit to clinic on 01/2020, she has been well.   She taking 25 mcg of methimazole twice per day. She thinks she has missed 1-2 doses since her last visit. She has not taken Atenolol, she recently lost the bottle.     Thyroid symptoms: Heat or cold intolerance: "I feel more room temperature"  Weight changes: unsure. (3 lbs loss)   Energy level: feels like it is much better  Sleep: Remains poor  Skin changes: denies Constipation/Diarrhea: denies Difficulty swallowing: denies Neck swelling: Denies Periods regular: "about the same". Tremor: "occasionally" Palpitations: Denies    ROS: All systems reviewed with pertinent positives listed below; otherwise negative. Constitutional: + 6 lbs weigh gain.  HEENT: No vision changes. No neck pain. No difficulty swallowing. Respiratory: No increased work of breathing currently Cardiac: intermittent tachycardia. No palpitations.  GI: No constipation or diarrhea, no stomach aches.  GU: No  nocturia. No polyuria.  Musculoskeletal: No joint deformity Neuro: Normal affect. + tremors  Endocrine: As above   Past Medical History:  Past Medical History:  Diagnosis Date  . ADHD (attention deficit hyperactivity disorder) 12/31/2010  . Hearing loss   . 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 Delivered at term Discharged home with mom  Meds: Outpatient Encounter Medications as of 03/22/2020  Medication Sig  . acetaminophen (TYLENOL) 325 MG tablet Take 2 tablets (650 mg total) by mouth every 4 (four) hours as needed for mild pain. (Patient not taking: No sig reported)  . ADDERALL XR 30 MG 24 hr capsule Take 2 capsules (60 mg total) by mouth daily. (Patient not taking: Reported on 03/22/2020)  . [START ON 04/06/2020] ADDERALL XR 30 MG 24 hr capsule Take 2 capsules (60 mg total) by mouth daily. (Patient not taking: No sig reported)  . atenolol (TENORMIN) 25 MG tablet Take 1 tablet (25 mg total) by mouth daily. Take in the morning if your heart rate is greater than 100.  . cetirizine (ZYRTEC) 10 MG tablet Take 1 tablet (10 mg total) by mouth daily. (Patient not taking: No sig reported)  . FERRETTS 325 (106 Fe) MG TABS tablet TAKE 1 TABLET (106 MG OF IRON TOTAL) BY MOUTH DAILY. (Patient not taking: Reported on 03/22/2020)  . fluticasone (FLONASE) 50 MCG/ACT nasal spray Place 1 spray into both nostrils daily. (Patient not taking: No sig reported)  . methimazole (TAPAZOLE) 10 MG tablet Take 3  tablets (30 mg total) by mouth 2 (two) times daily. (Patient not taking: Reported on 03/22/2020)  . norethindrone (MICRONOR) 0.35 MG tablet Take 1 tablet (0.35 mg total) by mouth daily. (Patient not taking: No sig reported)  . [DISCONTINUED] atenolol (TENORMIN) 25 MG tablet Take 1 tablet (25 mg total) by mouth daily. Take in the morning if your heart rate is greater than 100.   No facility-administered encounter medications on file  as of 03/22/2020.    Allergies: No Known Allergies  Surgical History: Past Surgical History:  Procedure Laterality Date  . TYMPANOSTOMY TUBE PLACEMENT     twice    Family History:  Family History  Problem Relation Age of Onset  . ADD / ADHD Brother   . ADD / ADHD Brother   . Diabetes Brother   . ADD / ADHD Brother   . Allergies Brother   . Diabetes Maternal Grandmother   . Hypertension Maternal Grandmother   . Cancer Maternal Grandfather   . Alcohol abuse Neg Hx   . Arthritis Neg Hx   . Asthma Neg Hx   . Birth defects Neg Hx   . COPD Neg Hx   . Depression Neg Hx   . Drug abuse Neg Hx   . Early death Neg Hx   . Hearing loss Neg Hx   . Heart disease Neg Hx   . Hyperlipidemia Neg Hx   . Kidney disease Neg Hx   . Learning disabilities Neg Hx   . Mental illness Neg Hx   . Mental retardation Neg Hx   . Miscarriages / Stillbirths Neg Hx   . Stroke Neg Hx   . Vision loss Neg Hx   . Varicose Veins Neg Hx      Social History: Lives with: Grandmother, 3 brother and 2 nieces.    Physical Exam:  Vitals:   03/22/20 1046  BP: 122/80  Pulse: (!) 140  Weight: 126 lb (57.2 kg)    Body mass index: body mass index is 21.54 kg/m. Growth percentile SmartLinks can only be used for patients less than 51 years old.  Wt Readings from Last 3 Encounters:  03/22/20 126 lb (57.2 kg)  02/22/20 122 lb 3.2 oz (55.4 kg)  12/30/19 125 lb 9.6 oz (57 kg)   Ht Readings from Last 3 Encounters:  10/19/19 5' 4.13" (1.629 m) (47 %, Z= -0.07)*  10/11/19 5\' 4"  (1.626 m) (45 %, Z= -0.12)*  06/24/19 5' 4.37" (1.635 m) (51 %, Z= 0.03)*   * Growth percentiles are based on CDC (Girls, 2-20 Years) data.     Facility age limit for growth percentiles is 20 years. Facility age limit for growth percentiles is 20 years. Facility age limit for growth percentiles is 20 years.  General: Well developed, well nourished female in no acute distress.   Head: Normocephalic, atraumatic.   Eyes:   Pupils equal and round. EOMI.   Sclera white.  No eye drainage.   Ears/Nose/Mouth/Throat: Nares patent, no nasal drainage.  Normal dentition, mucous membranes moist.   Neck: supple, no cervical lymphadenopathy, + thyromegaly. No nodules or tenderness  Cardiovascular: + tachycardia (112 on my count. She reports due to nerves), normal S1/S2, no murmurs Respiratory: No increased work of breathing.  Lungs clear to auscultation bilaterally.  No wheezes. Abdomen: soft, nontender, nondistended. Normal bowel sounds.  No appreciable masses  Extremities: warm, well perfused, cap refill < 2 sec.   Musculoskeletal: Normal muscle mass.  Normal strength Skin: warm, dry.  No  rash or lesions. Neurologic: alert and oriented, normal speech, + tremor to bilateral hands    Laboratory Evaluation:     Assessment/Plan:  Ria Redcay is a 20 y.o. female with hyperthyroidism/Graves disease. Has been more compliant with medications. Clinical improvement on 25 mg of methimazole BID.    1. Hyperthyroidism 2. Thyromegaly 3. Tachycardia 4. Tremor - TSH, FT4, T4, CBC and LFT's ordered  - 25 mcg of methimazole BID  - Atenolol once daily PRN for symptom control  - Discussed signs of thyroid storm and possible reaction to methimazole including when to contact clinic.     Follow-up:   1 month.   Medical decision-making:  >30  spent today reviewing the medical chart, counseling the patient/family, and documenting today's visit.      Gretchen Short,  FNP-C  [facility]  [address]  [place], [address]  Tele: [phone]

## 2020-03-23 LAB — CBC WITH DIFFERENTIAL/PLATELET
Absolute Monocytes: 220 cells/uL (ref 200–950)
Basophils Absolute: 0 cells/uL (ref 0–200)
Basophils Relative: 0 %
Eosinophils Absolute: 0 cells/uL — ABNORMAL LOW (ref 15–500)
Eosinophils Relative: 0 %
HCT: 38.2 % (ref 35.0–45.0)
Hemoglobin: 12.7 g/dL (ref 11.7–15.5)
Lymphs Abs: 1295 cells/uL (ref 850–3900)
MCH: 25.1 pg — ABNORMAL LOW (ref 27.0–33.0)
MCHC: 33.2 g/dL (ref 32.0–36.0)
MCV: 75.6 fL — ABNORMAL LOW (ref 80.0–100.0)
MPV: 9.5 fL (ref 7.5–12.5)
Monocytes Relative: 8.8 %
Neutro Abs: 985 cells/uL — ABNORMAL LOW (ref 1500–7800)
Neutrophils Relative %: 39.4 %
Platelets: 179 10*3/uL (ref 140–400)
RBC: 5.05 10*6/uL (ref 3.80–5.10)
RDW: 15.8 % — ABNORMAL HIGH (ref 11.0–15.0)
Total Lymphocyte: 51.8 %
WBC: 2.5 10*3/uL — ABNORMAL LOW (ref 3.8–10.8)

## 2020-03-23 LAB — HEPATIC FUNCTION PANEL
AG Ratio: 1.9 (calc) (ref 1.0–2.5)
ALT: 12 U/L (ref 6–29)
AST: 13 U/L (ref 10–30)
Albumin: 4.2 g/dL (ref 3.6–5.1)
Alkaline phosphatase (APISO): 87 U/L (ref 31–125)
Bilirubin, Direct: 0.2 mg/dL (ref 0.0–0.2)
Globulin: 2.2 g/dL (calc) (ref 1.9–3.7)
Indirect Bilirubin: 0.6 mg/dL (calc) (ref 0.2–1.2)
Total Bilirubin: 0.8 mg/dL (ref 0.2–1.2)
Total Protein: 6.4 g/dL (ref 6.1–8.1)

## 2020-03-23 LAB — T4: T4, Total: 6.9 ug/dL (ref 5.3–11.7)

## 2020-03-23 LAB — T4, FREE: Free T4: 1.1 ng/dL (ref 0.8–1.4)

## 2020-03-23 LAB — T3: T3, Total: 151 ng/dL (ref 86–192)

## 2020-03-23 LAB — TSH: TSH: 0.01 mIU/L — ABNORMAL LOW

## 2020-03-25 DIAGNOSIS — H9211 Otorrhea, right ear: Secondary | ICD-10-CM | POA: Diagnosis not present

## 2020-04-19 ENCOUNTER — Ambulatory Visit (INDEPENDENT_AMBULATORY_CARE_PROVIDER_SITE_OTHER): Payer: Medicaid Other | Admitting: Family

## 2020-04-19 ENCOUNTER — Other Ambulatory Visit: Payer: Self-pay

## 2020-04-19 ENCOUNTER — Encounter (INDEPENDENT_AMBULATORY_CARE_PROVIDER_SITE_OTHER): Payer: Self-pay | Admitting: Family

## 2020-04-19 VITALS — BP 132/80 | HR 130 | Wt 124.0 lb

## 2020-04-19 DIAGNOSIS — R300 Dysuria: Secondary | ICD-10-CM

## 2020-04-19 DIAGNOSIS — N926 Irregular menstruation, unspecified: Secondary | ICD-10-CM | POA: Diagnosis not present

## 2020-04-19 DIAGNOSIS — R000 Tachycardia, unspecified: Secondary | ICD-10-CM

## 2020-04-19 DIAGNOSIS — Z3202 Encounter for pregnancy test, result negative: Secondary | ICD-10-CM

## 2020-04-19 DIAGNOSIS — E0590 Thyrotoxicosis, unspecified without thyrotoxic crisis or storm: Secondary | ICD-10-CM | POA: Diagnosis not present

## 2020-04-19 DIAGNOSIS — E0500 Thyrotoxicosis with diffuse goiter without thyrotoxic crisis or storm: Secondary | ICD-10-CM

## 2020-04-19 LAB — POCT URINE PREGNANCY: Preg Test, Ur: NEGATIVE

## 2020-04-19 NOTE — Progress Notes (Signed)
Pediatric Endocrinology Consultation Follow Up Visit  [name] [name] [birth year]  Patient, No Pcp Per  Chief Complaint: Hyperthyroid/Graves  History obtained from: [name] [name], and review of records from PCP  HPI: [name] [name]  is a 20 y.o. female being seen in consultation at the request of  Patient, No Pcp Per for evaluation of the above concerns.   1.  [name] [name] was seen by her adolescent medicine on 01/2018 where she was noted to have lmenorrhagia so labs were ordered. Her TSH resulted at 0.01 with a FT4 of 1.9. [name] Ramus, NP contacted me at that time and I advised to repeat labs with thyroid antibodies. Her second set of labs showed elevated TSI that is positive for Graves disease, her TSH had also remained at 0.01 with a higher FT4 of 2.5 and elevated T3 of 395. She was started on 5 mg of Methimazole BID and 25 mg of Atenolol daily for symptom control.  [facility] for further evaluation.    2. Since her last visit to clinic on 02/2020, she has been well.   She was concerned that she was pregnant because she missed her period but she does have abnormal period.   She is taking 20 mg of Methimazole BID. She has only missed one dose. She reports feeling much better overall.   Thyroid symptoms: Heat or cold intolerance: Denies  Weight changes: unsure. (3 lbs loss)   Energy level: Good  Sleep: Better overall Started melatonin  Skin changes: denies Constipation/Diarrhea: denies Difficulty swallowing: denies Neck swelling: Denies Periods: Period is current " a few days" late but has irregular cycles.  Tremor: Denies  Palpitations: Denies    ROS: All systems reviewed with pertinent positives listed below; otherwise negative. Constitutional: 2 lbs weight loss. Sleeping improved.  HEENT: No vision changes. No neck pain. No difficulty swallowing. Respiratory: No increased work of breathing currently Cardiac: intermittent tachycardia. No  palpitations.  GI: No constipation or diarrhea, no stomach aches.  GU: No nocturia. No polyuria.  Musculoskeletal: No joint deformity Neuro: Normal affect. + tremors  Endocrine: As above   Past Medical History:  Past Medical History:  Diagnosis Date  . ADHD (attention deficit hyperactivity disorder) 12/31/2010  . Hearing loss   . 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 Delivered at term Discharged home with mom  Meds: Outpatient Encounter Medications as of 04/19/2020  Medication Sig  . atenolol (TENORMIN) 25 MG tablet Take 1 tablet (25 mg total) by mouth daily. Take in the morning if your heart rate is greater than 100.  Marland Kitchen acetaminophen (TYLENOL) 325 MG tablet Take 2 tablets (650 mg total) by mouth every 4 (four) hours as needed for mild pain. (Patient not taking: No sig reported)  . ADDERALL XR 30 MG 24 hr capsule Take 2 capsules (60 mg total) by mouth daily. (Patient not taking: Reported on 03/22/2020)  . ADDERALL XR 30 MG 24 hr capsule Take 2 capsules (60 mg total) by mouth daily. (Patient not taking: No sig reported)  . cetirizine (ZYRTEC) 10 MG tablet Take 1 tablet (10 mg total) by mouth daily. (Patient not taking: No sig reported)  . FERRETTS 325 (106 Fe) MG TABS tablet TAKE 1 TABLET (106 MG OF IRON TOTAL) BY MOUTH DAILY. (Patient not taking: No sig reported)  . fluticasone (FLONASE) 50 MCG/ACT nasal spray Place 1 spray into both nostrils daily. (Patient not taking: No sig reported)  .  methimazole (TAPAZOLE) 10 MG tablet Take 3 tablets (30 mg total) by mouth 2 (two) times daily. (Patient not taking: No sig reported)  . norethindrone (MICRONOR) 0.35 MG tablet Take 1 tablet (0.35 mg total) by mouth daily. (Patient not taking: No sig reported)   No facility-administered encounter medications on file as of 04/19/2020.    Allergies: No Known Allergies  Surgical History: Past Surgical History:  Procedure  Laterality Date  . TYMPANOSTOMY TUBE PLACEMENT     twice    Family History:  Family History  Problem Relation Age of Onset  . ADD / ADHD Brother   . ADD / ADHD Brother   . Diabetes Brother   . ADD / ADHD Brother   . Allergies Brother   . Diabetes Maternal Grandmother   . Hypertension Maternal Grandmother   . Cancer Maternal Grandfather   . Alcohol abuse Neg Hx   . Arthritis Neg Hx   . Asthma Neg Hx   . Birth defects Neg Hx   . COPD Neg Hx   . Depression Neg Hx   . Drug abuse Neg Hx   . Early death Neg Hx   . Hearing loss Neg Hx   . Heart disease Neg Hx   . Hyperlipidemia Neg Hx   . Kidney disease Neg Hx   . Learning disabilities Neg Hx   . Mental illness Neg Hx   . Mental retardation Neg Hx   . Miscarriages / Stillbirths Neg Hx   . Stroke Neg Hx   . Vision loss Neg Hx   . Varicose Veins Neg Hx      Social History: Lives with: Grandmother, 3 brother and 2 nieces.    Physical Exam:  Vitals:   04/19/20 1015 04/19/20 1045  BP: (!) 160/90 132/80  Pulse: (!) 140 (!) 130  Weight: 124 lb (56.2 kg)     Body mass index: body mass index is 21.2 kg/m. Growth percentile SmartLinks can only be used for patients less than 66 years old.  Wt Readings from Last 3 Encounters:  04/19/20 124 lb (56.2 kg)  03/22/20 126 lb (57.2 kg)  02/22/20 122 lb 3.2 oz (55.4 kg)   Ht Readings from Last 3 Encounters:  10/19/19 5' 4.13" (1.629 m) (47 %, Z= -0.07)*  10/11/19 5\' 4"  (1.626 m) (45 %, Z= -0.12)*  06/24/19 5' 4.37" (1.635 m) (51 %, Z= 0.03)*   * Growth percentiles are based on CDC (Girls, 2-20 Years) data.     Facility age limit for growth percentiles is 20 years. Facility age limit for growth percentiles is 20 years. Facility age limit for growth percentiles is 20 years.  General: Well developed, well nourished female in no acute distress.   Head: Normocephalic, atraumatic.   Eyes:  Pupils equal and round. EOMI.   Sclera white.  No eye drainage.    Ears/Nose/Mouth/Throat: Nares patent, no nasal drainage.  Normal dentition, mucous membranes moist.   Neck: supple, no cervical lymphadenopathy, no thyromegaly Cardiovascular: regular rate, normal S1/S2, no murmurs Respiratory: No increased work of breathing.  Lungs clear to auscultation bilaterally.  No wheezes. Abdomen: soft, nontender, nondistended. Normal bowel sounds.  No appreciable masses  Extremities: warm, well perfused, cap refill < 2 sec.   Musculoskeletal: Normal muscle mass.  Normal strength Skin: warm, dry.  No rash or lesions. Neurologic: alert and oriented, normal speech, + tremor   Laboratory Evaluation:     Assessment/Plan:  Cylee Dattilo is a 20 y.o. female with hyperthyroidism/Graves disease.  Doing well with compliance of Methimazole. Concern for pregnancy today due to missed menstrual cycle but no other symptoms. Pregnancy test negative.     1. Hyperthyroidism 2. Thyromegaly 3. Tachycardia 4. Tremor - TSH, FT4, T4, CBC and LFT's ordered  - 20 mg of methimazole BID  - Atenolol once daily PRN for symptom control  - Discussed signs of thyroid storm and possible reaction to methimazole including when to contact clinic.   5. Missed period  - Stressed importance of birth control to prevent pregnancy while she is on Methimazole  - When she wants to start trying for pregnancy she will need to see adult endocrine to plan  - Urine pregnancy test negative.     Follow-up:   1 month.   Medical decision-making:  >45 spent today reviewing the medical chart, counseling the patient/family, and documenting today's visit.      Gretchen Short,  FNP-C  [facility]  [address]  [place], [address]  Tele: [phone]

## 2020-04-19 NOTE — Patient Instructions (Signed)
20 mg of methimazole BID  Labs today and will titrate dose pending labs Need to discuss pregnancy prevention with PCP due to risk associated with hyperthyroidism and medication.

## 2020-04-20 LAB — CBC WITH DIFFERENTIAL/PLATELET
Absolute Monocytes: 398 cells/uL (ref 200–950)
Basophils Absolute: 0 cells/uL (ref 0–200)
Basophils Relative: 0 %
Eosinophils Absolute: 0 cells/uL — ABNORMAL LOW (ref 15–500)
Eosinophils Relative: 0 %
HCT: 39 % (ref 35.0–45.0)
Hemoglobin: 12.9 g/dL (ref 11.7–15.5)
Lymphs Abs: 1028 cells/uL (ref 850–3900)
MCH: 25.4 pg — ABNORMAL LOW (ref 27.0–33.0)
MCHC: 33.1 g/dL (ref 32.0–36.0)
MCV: 76.9 fL — ABNORMAL LOW (ref 80.0–100.0)
MPV: 9.5 fL (ref 7.5–12.5)
Monocytes Relative: 15.9 %
Neutro Abs: 1075 cells/uL — ABNORMAL LOW (ref 1500–7800)
Neutrophils Relative %: 43 %
Platelets: 192 10*3/uL (ref 140–400)
RBC: 5.07 10*6/uL (ref 3.80–5.10)
RDW: 15 % (ref 11.0–15.0)
Total Lymphocyte: 41.1 %
WBC: 2.5 10*3/uL — ABNORMAL LOW (ref 3.8–10.8)

## 2020-04-20 LAB — TSH: TSH: 0.01 mIU/L — ABNORMAL LOW

## 2020-04-20 LAB — T3: T3, Total: 145 ng/dL (ref 86–192)

## 2020-04-20 LAB — T4: T4, Total: 8.6 ug/dL (ref 5.3–11.7)

## 2020-04-20 LAB — T4, FREE: Free T4: 1.4 ng/dL (ref 0.8–1.4)

## 2020-04-25 DIAGNOSIS — H6641 Suppurative otitis media, unspecified, right ear: Secondary | ICD-10-CM | POA: Diagnosis not present

## 2020-04-25 DIAGNOSIS — N912 Amenorrhea, unspecified: Secondary | ICD-10-CM | POA: Diagnosis not present

## 2020-05-03 DIAGNOSIS — Z1339 Encounter for screening examination for other mental health and behavioral disorders: Secondary | ICD-10-CM | POA: Diagnosis not present

## 2020-05-03 DIAGNOSIS — Z1331 Encounter for screening for depression: Secondary | ICD-10-CM | POA: Diagnosis not present

## 2020-05-03 DIAGNOSIS — R0602 Shortness of breath: Secondary | ICD-10-CM | POA: Diagnosis not present

## 2020-05-03 DIAGNOSIS — E559 Vitamin D deficiency, unspecified: Secondary | ICD-10-CM | POA: Diagnosis not present

## 2020-05-03 DIAGNOSIS — Z1159 Encounter for screening for other viral diseases: Secondary | ICD-10-CM | POA: Diagnosis not present

## 2020-05-03 DIAGNOSIS — Z0000 Encounter for general adult medical examination without abnormal findings: Secondary | ICD-10-CM | POA: Diagnosis not present

## 2020-05-03 DIAGNOSIS — N914 Secondary oligomenorrhea: Secondary | ICD-10-CM | POA: Diagnosis not present

## 2020-05-03 DIAGNOSIS — E0590 Thyrotoxicosis, unspecified without thyrotoxic crisis or storm: Secondary | ICD-10-CM | POA: Diagnosis not present

## 2020-05-17 NOTE — Patient Instructions (Signed)
At [facility], we are committed to providing exceptional care. You will receive a patient satisfaction survey through text or email regarding your visit today. Your opinion is important to me. Comments are appreciated.  

## 2020-05-18 DIAGNOSIS — Z79899 Other long term (current) drug therapy: Secondary | ICD-10-CM | POA: Diagnosis not present

## 2020-05-18 DIAGNOSIS — E559 Vitamin D deficiency, unspecified: Secondary | ICD-10-CM | POA: Diagnosis not present

## 2020-05-18 DIAGNOSIS — F909 Attention-deficit hyperactivity disorder, unspecified type: Secondary | ICD-10-CM | POA: Diagnosis not present

## 2020-05-18 DIAGNOSIS — E0590 Thyrotoxicosis, unspecified without thyrotoxic crisis or storm: Secondary | ICD-10-CM | POA: Diagnosis not present

## 2020-05-19 ENCOUNTER — Ambulatory Visit (INDEPENDENT_AMBULATORY_CARE_PROVIDER_SITE_OTHER): Payer: Medicaid Other | Admitting: Family

## 2020-05-19 ENCOUNTER — Other Ambulatory Visit: Payer: Self-pay

## 2020-05-19 ENCOUNTER — Encounter (INDEPENDENT_AMBULATORY_CARE_PROVIDER_SITE_OTHER): Payer: Self-pay | Admitting: Family

## 2020-05-19 VITALS — BP 122/74 | HR 134 | Wt 128.8 lb

## 2020-05-19 DIAGNOSIS — E0590 Thyrotoxicosis, unspecified without thyrotoxic crisis or storm: Secondary | ICD-10-CM | POA: Diagnosis not present

## 2020-05-19 DIAGNOSIS — E0500 Thyrotoxicosis with diffuse goiter without thyrotoxic crisis or storm: Secondary | ICD-10-CM | POA: Diagnosis not present

## 2020-05-19 DIAGNOSIS — R000 Tachycardia, unspecified: Secondary | ICD-10-CM

## 2020-05-19 NOTE — Progress Notes (Signed)
Pediatric Endocrinology Consultation Follow Up Visit  [name], [name] 11/17/2000  Patient, No Pcp Per (Inactive)  Chief Complaint: Hyperthyroid/Graves  History obtained from: Vanya, and review of records from PCP  HPI: [name] [name]  is a 20 y.o. female being seen in consultation at the request of  Patient, No Pcp Per (Inactive) for evaluation of the above concerns.   1.  [name] [name] was seen by her adolescent medicine on 01/2018 where she was noted to have lmenorrhagia so labs were ordered. Her TSH resulted at 0.01 with a FT4 of 1.9. [name], NP contacted me at that time and I advised to repeat labs with thyroid antibodies. Her second set of labs showed elevated TSI that is positive for Graves disease, her TSH had also remained at 0.01 with a higher FT4 of 2.5 and elevated T3 of 395. She was started on 5 mg of Methimazole BID and 25 mg of Atenolol daily for symptom control.  [facility] for further evaluation.    2. Since her last visit to clinic on 03/2020, she has been well.   Taking 20 mg of Methimazole BID. She reports she missed 3 days of medication because the pharmacy did not have it ready. She has missed 1-2 morning doses if she sleeps late. She has not used Atenolol. She monitors her heart rate every morning.   Thyroid symptoms: Heat or cold intolerance: Denies  Weight changes: 4 lbs weight gain  Energy level: Very good  Sleep: Better on Melatonin  Skin changes: denies Constipation/Diarrhea: denies Difficulty swallowing: denies Neck swelling: Denies Periods: Regular since last appointment.  Tremor: Denies  Palpitations: Rare.    ROS: All systems reviewed with pertinent positives listed below; otherwise negative. Constitutional: weight as above.  Sleeping improved.  HEENT: No vision changes. No neck pain. No difficulty swallowing. Respiratory: No increased work of breathing currently Cardiac: intermittent tachycardia. No  palpitations.  GI: No constipation or diarrhea, no stomach aches.  GU: No nocturia. No polyuria.  Musculoskeletal: No joint deformity Neuro: Normal affect. + tremors  Endocrine: As above   Past Medical History:  Past Medical History:  Diagnosis Date  . ADHD (attention deficit hyperactivity disorder) 12/31/2010  . Hearing loss   . 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 Delivered at term Discharged home with mom  Meds: Outpatient Encounter Medications as of 05/19/2020  Medication Sig Note  . ADDERALL XR 30 MG 24 hr capsule Take 2 capsules (60 mg total) by mouth daily.   . cetirizine (ZYRTEC) 10 MG tablet Take 1 tablet (10 mg total) by mouth daily.   Marland Kitchen FERRETTS 325 (106 Fe) MG TABS tablet TAKE 1 TABLET (106 MG OF IRON TOTAL) BY MOUTH DAILY.   . fluticasone (FLONASE) 50 MCG/ACT nasal spray Place 1 spray into both nostrils daily.   . methimazole (TAPAZOLE) 10 MG tablet Take 3 tablets (30 mg total) by mouth 2 (two) times daily. 05/19/2020: 2 tablets twice a day  . Vitamin D, Ergocalciferol, (DRISDOL) 1.25 MG (50000 UNIT) CAPS capsule Take by mouth.   Marland Kitchen acetaminophen (TYLENOL) 325 MG tablet Take 2 tablets (650 mg total) by mouth every 4 (four) hours as needed for mild pain. (Patient not taking: No sig reported)   . ADDERALL XR 30 MG 24 hr capsule Take 2 capsules (60 mg total) by mouth daily. (Patient not taking: No sig reported)   . atenolol (TENORMIN) 25 MG tablet  Take 1 tablet (25 mg total) by mouth daily. Take in the morning if your heart rate is greater than 100. (Patient not taking: Reported on 05/19/2020)   . norethindrone (MICRONOR) 0.35 MG tablet Take 1 tablet (0.35 mg total) by mouth daily. (Patient not taking: No sig reported)    No facility-administered encounter medications on file as of 05/19/2020.    Allergies: No Known Allergies  Surgical History: Past Surgical History:  Procedure Laterality Date   . TYMPANOSTOMY TUBE PLACEMENT     twice    Family History:  Family History  Problem Relation Age of Onset  . ADD / ADHD Brother   . ADD / ADHD Brother   . Diabetes Brother   . ADD / ADHD Brother   . Allergies Brother   . Diabetes Maternal Grandmother   . Hypertension Maternal Grandmother   . Cancer Maternal Grandfather   . Alcohol abuse Neg Hx   . Arthritis Neg Hx   . Asthma Neg Hx   . Birth defects Neg Hx   . COPD Neg Hx   . Depression Neg Hx   . Drug abuse Neg Hx   . Early death Neg Hx   . Hearing loss Neg Hx   . Heart disease Neg Hx   . Hyperlipidemia Neg Hx   . Kidney disease Neg Hx   . Learning disabilities Neg Hx   . Mental illness Neg Hx   . Mental retardation Neg Hx   . Miscarriages / Stillbirths Neg Hx   . Stroke Neg Hx   . Vision loss Neg Hx   . Varicose Veins Neg Hx      Social History: Lives with: Grandmother, 3 brother and 2 nieces.    Physical Exam:  Vitals:   05/19/20 0837  BP: 122/74  Pulse: (!) 134  Weight: 128 lb 12.8 oz (58.4 kg)    Body mass index: body mass index is 22.02 kg/m. Growth percentile SmartLinks can only be used for patients less than 73 years old.  Wt Readings from Last 3 Encounters:  05/19/20 128 lb 12.8 oz (58.4 kg)  04/19/20 124 lb (56.2 kg)  03/22/20 126 lb (57.2 kg)   Ht Readings from Last 3 Encounters:  10/19/19 5' 4.13" (1.629 m) (47 %, Z= -0.07)*  10/11/19 5\' 4"  (1.626 m) (45 %, Z= -0.12)*  06/24/19 5' 4.37" (1.635 m) (51 %, Z= 0.03)*   * Growth percentiles are based on CDC (Girls, 2-20 Years) data.     Facility age limit for growth percentiles is 20 years. Facility age limit for growth percentiles is 20 years. Facility age limit for growth percentiles is 20 years.  General: Well developed, well nourished female in no acute distress.  Head: Normocephalic, atraumatic.   Eyes:  Pupils equal and round. EOMI.   Sclera white.  No eye drainage.  + bilateral exophthalmos  Ears/Nose/Mouth/Throat: Nares  patent, no nasal drainage.  Normal dentition, mucous membranes moist.   Neck: supple, no cervical lymphadenopathy, no thyromegaly Cardiovascular: regular rate, normal S1/S2, no murmurs Respiratory: No increased work of breathing.  Lungs clear to auscultation bilaterally.  No wheezes. Abdomen: soft, nontender, nondistended. Normal bowel sounds.  No appreciable masses  Extremities: warm, well perfused, cap refill < 2 sec.   Musculoskeletal: Normal muscle mass.  Normal strength Skin: warm, dry.  No rash or lesions. Neurologic: alert and oriented, normal speech, + mild tremor  Laboratory Evaluation:     Assessment/Plan:  [name] [name] is a 20 y.o. female  with hyperthyroidism/Graves disease. No clinical change since last visit. Continue to follow labs with close consideration for thyroidectomy if worsening or lack of improvement.    1. Hyperthyroidism 2. Thyromegaly 3. Tachycardia 4. Tremor - 20 mg of MEthiamzole BID  - TSH, FT4, T4 and T3 ordered  - Reviewed s.s of hyperthyroidism and thyroid storm.  - Answered questions.    Follow-up:   1 month.   Medical decision-making:  >35  spent today reviewing the medical chart, counseling the patient/family, and documenting today's visit.       Gretchen Short,  FNP-C  [facility]  [address]  [place], [address]  Tele: [phone]

## 2020-05-20 LAB — CBC WITH DIFFERENTIAL/PLATELET
Absolute Monocytes: 492 cells/uL (ref 200–950)
Basophils Absolute: 0 cells/uL (ref 0–200)
Basophils Relative: 0 %
Eosinophils Absolute: 0 cells/uL — ABNORMAL LOW (ref 15–500)
Eosinophils Relative: 0 %
HCT: 40 % (ref 35.0–45.0)
Hemoglobin: 12.7 g/dL (ref 11.7–15.5)
Lymphs Abs: 1181 cells/uL (ref 850–3900)
MCH: 24 pg — ABNORMAL LOW (ref 27.0–33.0)
MCHC: 31.8 g/dL — ABNORMAL LOW (ref 32.0–36.0)
MCV: 75.5 fL — ABNORMAL LOW (ref 80.0–100.0)
MPV: 10.4 fL (ref 7.5–12.5)
Monocytes Relative: 12 %
Neutro Abs: 2427 cells/uL (ref 1500–7800)
Neutrophils Relative %: 59.2 %
Platelets: 189 10*3/uL (ref 140–400)
RBC: 5.3 10*6/uL — ABNORMAL HIGH (ref 3.80–5.10)
RDW: 13.6 % (ref 11.0–15.0)
Total Lymphocyte: 28.8 %
WBC: 4.1 10*3/uL (ref 3.8–10.8)

## 2020-05-20 LAB — T4: T4, Total: 7.4 ug/dL (ref 5.3–11.7)

## 2020-05-20 LAB — T3: T3, Total: 152 ng/dL (ref 86–192)

## 2020-05-20 LAB — T4, FREE: Free T4: 1.4 ng/dL (ref 0.8–1.4)

## 2020-05-20 LAB — TSH: TSH: <0.01 mIU/L — ABNORMAL LOW

## 2020-05-29 ENCOUNTER — Encounter (INDEPENDENT_AMBULATORY_CARE_PROVIDER_SITE_OTHER): Payer: Self-pay

## 2020-06-16 ENCOUNTER — Ambulatory Visit (INDEPENDENT_AMBULATORY_CARE_PROVIDER_SITE_OTHER): Payer: Medicaid Other | Admitting: Family

## 2020-06-23 ENCOUNTER — Ambulatory Visit (INDEPENDENT_AMBULATORY_CARE_PROVIDER_SITE_OTHER): Payer: Medicaid Other | Admitting: Family

## 2020-06-26 DIAGNOSIS — F909 Attention-deficit hyperactivity disorder, unspecified type: Secondary | ICD-10-CM | POA: Diagnosis not present

## 2020-06-26 DIAGNOSIS — E559 Vitamin D deficiency, unspecified: Secondary | ICD-10-CM | POA: Diagnosis not present

## 2020-06-26 DIAGNOSIS — Z6822 Body mass index (BMI) 22.0-22.9, adult: Secondary | ICD-10-CM | POA: Diagnosis not present

## 2020-06-26 DIAGNOSIS — E0590 Thyrotoxicosis, unspecified without thyrotoxic crisis or storm: Secondary | ICD-10-CM | POA: Diagnosis not present

## 2020-07-27 DIAGNOSIS — Z6821 Body mass index (BMI) 21.0-21.9, adult: Secondary | ICD-10-CM | POA: Diagnosis not present

## 2020-07-27 DIAGNOSIS — N914 Secondary oligomenorrhea: Secondary | ICD-10-CM | POA: Diagnosis not present

## 2020-07-27 DIAGNOSIS — R030 Elevated blood-pressure reading, without diagnosis of hypertension: Secondary | ICD-10-CM | POA: Diagnosis not present

## 2020-07-27 DIAGNOSIS — Z79899 Other long term (current) drug therapy: Secondary | ICD-10-CM | POA: Diagnosis not present

## 2020-08-02 DIAGNOSIS — Z79899 Other long term (current) drug therapy: Secondary | ICD-10-CM | POA: Diagnosis not present

## 2020-08-17 ENCOUNTER — Ambulatory Visit (INDEPENDENT_AMBULATORY_CARE_PROVIDER_SITE_OTHER): Payer: Medicaid Other | Admitting: Family

## 2020-09-26 ENCOUNTER — Ambulatory Visit (INDEPENDENT_AMBULATORY_CARE_PROVIDER_SITE_OTHER): Payer: Medicaid Other | Admitting: Family

## 2020-11-23 DIAGNOSIS — H5213 Myopia, bilateral: Secondary | ICD-10-CM | POA: Diagnosis not present

## 2021-02-02 DIAGNOSIS — H52221 Regular astigmatism, right eye: Secondary | ICD-10-CM | POA: Diagnosis not present

## 2021-02-02 DIAGNOSIS — H5213 Myopia, bilateral: Secondary | ICD-10-CM | POA: Diagnosis not present

## 2021-09-10 ENCOUNTER — Encounter: Payer: Self-pay | Admitting: Pediatrics

## 2023-05-06 ENCOUNTER — Encounter (INDEPENDENT_AMBULATORY_CARE_PROVIDER_SITE_OTHER): Payer: Self-pay

## 2023-05-19 ENCOUNTER — Encounter (INDEPENDENT_AMBULATORY_CARE_PROVIDER_SITE_OTHER): Payer: Self-pay
